# Patient Record
Sex: Female | Born: 1995 | Race: White | Hispanic: No | Marital: Single | Smoking: Former smoker
Health system: Southern US, Community
[De-identification: ages and names within clinical notes are randomized; demographics above are authoritative.]

---
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
## 2017-03-10 ENCOUNTER — Other Ambulatory Visit: Payer: Self-pay | Admitting: Obstetrics and Gynecology

## 2017-03-13 ENCOUNTER — Other Ambulatory Visit: Payer: Self-pay | Admitting: Obstetrics and Gynecology

## 2018-02-05 ENCOUNTER — Ambulatory Visit: Payer: Self-pay | Admitting: Obstetrics and Gynecology

## 2018-04-02 ENCOUNTER — Ambulatory Visit
Admission: EM | Admit: 2018-04-02 | Discharge: 2018-04-02 | Disposition: A | Discharge status: Home / Self Care | Payer: Commercial Managed Care - PPO | Attending: Family Medicine | Admitting: Family Medicine

## 2018-04-02 ENCOUNTER — Other Ambulatory Visit: Payer: Self-pay

## 2018-04-02 ENCOUNTER — Encounter: Payer: Self-pay | Admitting: Emergency Medicine

## 2018-04-02 DIAGNOSIS — J029 Acute pharyngitis, unspecified: Secondary | ICD-10-CM

## 2018-04-02 LAB — RAPID STREP SCREEN (MED CTR MEBANE ONLY): Streptococcus, Group A Screen (Direct): NEGATIVE

## 2018-04-02 MED ORDER — AMOXICILLIN-POT CLAVULANATE 875-125 MG PO TABS: 1.0000 | ORAL_TABLET | Freq: Two times a day (BID) | ORAL | 0 refills | Status: DC

## 2018-04-02 NOTE — ED Provider Notes (Signed)
[facility]    CSN: [id number] Arrival date & time: 04/02/18  1452     History   Chief Complaint Chief Complaint  Patient presents with  . Sore Throat    HPI Elizabeth Salazar is a 22 y.o. female.   HPI  22 year old female accompanied by her mother presents with a sore throat that she has had for over 3 weeks.  States at the beginning of her illness she had a temperature of 102-103 but has since become afebrile.  She is tried over-the-counter TheraFlu ibuprofen and throat lozenges but continues to have pain.  Is mostly right-sided.  History of mono  in the 2015.  Has under cervical nodes on both sides.  Afebrile today.        History reviewed. No pertinent past medical history.  There are no active problems to display for this patient.   History reviewed. No pertinent surgical history.  OB History   None      Home Medications    Prior to Admission medications   Medication Sig Start Date End Date Taking? Authorizing Provider  amoxicillin-clavulanate (AUGMENTIN) 875-125 MG tablet Take 1 tablet by mouth every 12 (twelve) hours. 04/02/18   Lutricia Feil, PA-C    Family History Family History  Problem Relation Age of Onset  . Healthy Mother   . Healthy Father     Social History Social History   Tobacco Use  . Smoking status: Former Smoker    Last attempt to quit: 03/03/2018    Years since quitting: 0.0  . Smokeless tobacco: Never Used  . Tobacco comment: social smoker  Substance Use Topics  . Alcohol use: Yes    Comment: socially  . Drug use: Never     Allergies   Patient has no known allergies.   Review of Systems Review of Systems  Constitutional: Positive for fatigue. Negative for activity change, appetite change, chills and fever.  HENT: Positive for sore throat.   All other systems reviewed and are negative.    Physical Exam Triage Vital Signs ED Triage Vitals  Enc Vitals Group     BP 04/02/18 1513 114/86   Pulse Rate 04/02/18 1513 75     Resp 04/02/18 1513 16     Temp 04/02/18 1513 98.7 F (37.1 C)     Temp Source 04/02/18 1513 Oral     SpO2 04/02/18 1513 100 %     Weight 04/02/18 1514 95 lb (43.1 kg)     Height 04/02/18 1514  (1.626 m)     Head Circumference --      Peak Flow --      Pain Score 04/02/18 1513 3     Pain Loc --      Pain Edu? --      Excl. in GC? --    No data found.  Updated Vital Signs BP 114/86 (BP Location: Left Arm)   Pulse 75   Temp 98.7 F (37.1 C) (Oral)   Resp 16   Ht  (1.626 m)   Wt 95 lb (43.1 kg)   LMP 04/02/2018 (Exact Date)   SpO2 100%   BMI 16.31 kg/m   Visual Acuity Right Eye Distance:   Left Eye Distance:   Bilateral Distance:    Right Eye Near:   Left Eye Near:    Bilateral Near:     Physical Exam  Constitutional: She is oriented to person, place, and time. She appears well-developed and well-nourished.  Non-toxic appearance. She does not appear ill. No distress.  HENT:  Head: Normocephalic.  Right Ear: Tympanic membrane and ear canal normal.  Left Ear: Tympanic membrane and ear canal normal.  Mouth/Throat: Uvula is midline and mucous membranes are normal. Oral lesions present. No uvula swelling. Oropharyngeal exudate and posterior oropharyngeal edema present. No posterior oropharyngeal erythema or tonsillar abscesses. Tonsils are 2+ on the right. Tonsillar exudate.  Examination of the throat shows enlargement of the right tonsil.  There is a small pudunculated lesion attached to the tonsil and appears to be part of the tonsillar tissue.  In the crypt are tonsillar stones.  She has extensive anterior cervical adenopathy bilateral  Eyes: Pupils are equal, round, and reactive to light.  Neck: Normal range of motion.  Abdominal: Soft.  Lymphadenopathy:    She has cervical adenopathy.  Neurological: She is alert and oriented to person, place, and time.  Skin: Skin is warm and dry.  Psychiatric: She has a normal mood and  affect. Her behavior is normal.  Nursing note and vitals reviewed.    UC Treatments / Results  Labs (all labs ordered are listed, but only abnormal results are displayed) Labs Reviewed  RAPID STREP SCREEN ([facility] ONLY)  CULTURE, GROUP A STREP [facility])    EKG None  Radiology No results found.  Procedures Procedures (including critical care time)  Medications Ordered in UC Medications - No data to display  Initial Impression / Assessment and Plan / UC Course  I have reviewed the triage vital signs and the nursing notes.  Pertinent labs & imaging results that were available during my care of the patient were reviewed by me and considered in my medical decision making (see chart for details).     Plan: 1. Test/x-ray results and diagnosis reviewed with patient 2. rx as per orders; risks, benefits, potential side effects reviewed with patient 3. Recommend supportive treatment with salt water gargles and lozenges for comfort.  Her empirically with Augmentin.  Recommend that she follow-up with ear nose and throat next week.  Cultures and sensitivities will be available in 48 hours 4. F/u prn if symptoms worsen or don't improve  Final Clinical Impressions(s) / UC Diagnoses   Final diagnoses:  Sore throat   Discharge Instructions   None    ED Prescriptions    Medication Sig Dispense Auth. Provider   amoxicillin-clavulanate (AUGMENTIN) 875-125 MG tablet Take 1 tablet by mouth every 12 (twelve) hours. 20 tablet Lutricia Feil, PA-C     Controlled Substance Prescriptions [place] Controlled Substance Registry consulted? Not Applicable   Lutricia Feil, PA-C 04/02/18 1609

## 2018-04-02 NOTE — ED Triage Notes (Signed)
Patient in today c/o sore throat x 3 weeks. Patient states she gets strep throat around this time every year. Patient had fever at the beginning of the illness of 102-103. Patient has tried OTC Theraflu and Ibuprofen and throat lozenges.

## 2018-04-05 LAB — CULTURE, GROUP A STREP (THRC)

## 2018-04-08 ENCOUNTER — Telehealth (HOSPITAL_COMMUNITY): Payer: Self-pay

## 2018-04-08 NOTE — Telephone Encounter (Signed)
Culture is positive for non group A Strep germ.  This is a finding of uncertain significance; not the typical 'strep throat' germ.  Pt states she is feeling better, already on Amoxicillin. Pt encouraged to finish treatment.

## 2018-04-19 ENCOUNTER — Ambulatory Visit: Payer: Self-pay | Admitting: Obstetrics and Gynecology

## 2018-06-11 ENCOUNTER — Other Ambulatory Visit (HOSPITAL_COMMUNITY)
Admission: RE | Admit: 2018-06-11 | Discharge: 2018-06-11 | Disposition: A | Discharge status: Home / Self Care | Payer: Commercial Managed Care - PPO | Source: Ambulatory Visit | Attending: Obstetrics and Gynecology | Admitting: Obstetrics and Gynecology

## 2018-06-11 ENCOUNTER — Encounter: Payer: Self-pay | Admitting: Obstetrics and Gynecology

## 2018-06-11 ENCOUNTER — Ambulatory Visit (INDEPENDENT_AMBULATORY_CARE_PROVIDER_SITE_OTHER): Payer: Commercial Managed Care - PPO | Admitting: Obstetrics and Gynecology

## 2018-06-11 VITALS — BP 96/62 | HR 92 | Ht 64.0 in | Wt 99.0 lb

## 2018-06-11 DIAGNOSIS — B9689 Other specified bacterial agents as the cause of diseases classified elsewhere: Secondary | ICD-10-CM

## 2018-06-11 DIAGNOSIS — Z113 Encounter for screening for infections with a predominantly sexual mode of transmission: Secondary | ICD-10-CM

## 2018-06-11 DIAGNOSIS — N760 Acute vaginitis: Secondary | ICD-10-CM | POA: Diagnosis not present

## 2018-06-11 DIAGNOSIS — Z3009 Encounter for other general counseling and advice on contraception: Secondary | ICD-10-CM

## 2018-06-11 LAB — POCT WET PREP WITH KOH
Clue Cells Wet Prep HPF POC: POSITIVE
KOH PREP POC: POSITIVE — AB
TRICHOMONAS UA: NEGATIVE
YEAST WET PREP PER HPF POC: NEGATIVE

## 2018-06-11 MED ORDER — METRONIDAZOLE 500 MG PO TABS: 500.0000 mg | ORAL_TABLET | Freq: Two times a day (BID) | ORAL | 0 refills | Status: DC

## 2018-06-11 NOTE — Progress Notes (Signed)
Patient, No Pcp Per   Chief Complaint  Patient presents with  . Gynecologic Exam    HPI:      Elizabeth Salazar is a 22 y.o. No obstetric history on file. who LMP was Patient's last menstrual period was 04/24/2018 (exact date)., presents today for increased d/c with odor, mild itch for the past 2 months. She has been treating with OTC pH balance supp without relief. No LBP, belly pain, fevers. No hx of BV. No recent abx use.   She is sex active, currently on depo for 2 inj. Last shot 5/19. She has had irreg bleeding and wants to change to non-daily method. Did OCPs in past but can't take at same time daily.   Past due for annual.    History reviewed. No pertinent past medical history.  History reviewed. No pertinent surgical history.  Family History  Problem Relation Age of Onset  . Healthy Mother   . Healthy Father     Social History   Socioeconomic History  . Marital status: Single    Spouse name: Not on file  . Number of children: Not on file  . Years of education: Not on file  . Highest education level: Not on file  Occupational History  . Not on file  Social Needs  . Financial resource strain: Not on file  . Food insecurity:    Worry: Not on file    Inability: Not on file  . Transportation needs:    Medical: Not on file    Non-medical: Not on file  Tobacco Use  . Smoking status: Former Smoker    Last attempt to quit: 03/03/2018    Years since quitting: 0.2  . Smokeless tobacco: Never Used  . Tobacco comment: social smoker  Substance and Sexual Activity  . Alcohol use: Yes    Comment: socially  . Drug use: Never  . Sexual activity: Yes  Lifestyle  . Physical activity:    Days per week: Not on file    Minutes per session: Not on file  . Stress: Not on file  Relationships  . Social connections:    Talks on phone: Not on file    Gets together: Not on file    Attends religious service: Not on file    Active member of club or  organization: Not on file    Attends meetings of clubs or organizations: Not on file    Relationship status: Not on file  . Intimate partner violence:    Fear of current or ex partner: Not on file    Emotionally abused: Not on file    Physically abused: Not on file    Forced sexual activity: Not on file  Other Topics Concern  . Not on file  Social History Narrative  . Not on file    Outpatient Medications Prior to Visit  Medication Sig Dispense Refill  . amoxicillin-clavulanate (AUGMENTIN) 875-125 MG tablet Take 1 tablet by mouth every 12 (twelve) hours. 20 tablet 0   No facility-administered medications prior to visit.       ROS:  Review of Systems  Constitutional: Positive for fatigue. Negative for fever.  Gastrointestinal: Negative for blood in stool, constipation, diarrhea, nausea and vomiting.  Genitourinary: Positive for dyspareunia and vaginal discharge. Negative for dysuria, flank pain, frequency, hematuria, urgency, vaginal bleeding and vaginal pain.  Musculoskeletal: Negative for back pain.  Skin: Negative for rash.    OBJECTIVE:   Vitals:  BP 96/62 (BP  Location: Left Arm, Patient Position: Sitting, Cuff Size: Normal)   Pulse 92   Ht 5\' 4"  (1.626 m)   Wt 99 lb (44.9 kg)   LMP 04/24/2018 (Exact Date)   SpO2 100%   BMI 16.99 kg/m   Physical Exam  Constitutional: She is oriented to person, place, and time. Vital signs are normal. She appears well-developed.  Pulmonary/Chest: Effort normal.  Genitourinary: Vagina normal and uterus normal. There is no rash, tenderness or lesion on the right labia. There is no rash, tenderness or lesion on the left labia. Uterus is not enlarged and not tender. Cervix exhibits no motion tenderness. Right adnexum displays no mass and no tenderness. Left adnexum displays no mass and no tenderness. No erythema or tenderness in the vagina. No vaginal discharge found.  Musculoskeletal: Normal range of motion.  Neurological: She is  alert and oriented to person, place, and time.  Psychiatric: She has a normal mood and affect. Her behavior is normal. Thought content normal.  Vitals reviewed.   Results: Results for orders placed or performed in visit on 06/11/18 (from the past 24 hour(s))  POCT Wet Prep with KOH     Status: Abnormal   Collection Time: 06/11/18 10:18 AM  Result Value Ref Range   Trichomonas, UA Negative    Clue Cells Wet Prep HPF POC pos    Epithelial Wet Prep HPF POC  Few, Moderate, Many, Too numerous to count   Yeast Wet Prep HPF POC neg    Bacteria Wet Prep HPF POC  Few   RBC Wet Prep HPF POC     WBC Wet Prep HPF POC     KOH Prep POC Positive (A) Negative     Assessment/Plan: Bacterial vaginosis - Pos sx/wet prep. Rx flagyl. No EtOH. Will RF if sx recur. F/u prn.  - Plan: POCT Wet Prep with KOH, metroNIDAZOLE (FLAGYL) 500 MG tablet  Screening for STD (sexually transmitted disease) - Plan: Cervicovaginal ancillary only  Encounter for other general counseling or advice on contraception - Discussed non-daily methods. Pt due to start new BC by 07/03/18. RTO for annual/BC Rx.    Meds ordered this encounter  Medications  . metroNIDAZOLE (FLAGYL) 500 MG tablet    Sig: Take 1 tablet (500 mg total) by mouth 2 (two) times daily for 7 days.    Dispense:  14 tablet    Refill:  0    Order Specific Question:   Supervising Provider    Answer:   Nadara Mustard [[id number]]      Return in about 2 weeks (around 06/25/2018) for annual.  Elizabeth B. Copland, PA-C 06/11/2018 10:20 AM

## 2018-06-11 NOTE — Patient Instructions (Signed)
I value your feedback and entrusting us with your care. If you get a [facility] patient survey, I would appreciate you taking the time to let us know about your experience today. Thank you! 

## 2018-06-13 LAB — CERVICOVAGINAL ANCILLARY ONLY
Chlamydia: NEGATIVE
Neisseria Gonorrhea: NEGATIVE
TRICH (WINDOWPATH): NEGATIVE

## 2018-07-04 DIAGNOSIS — B009 Herpesviral infection, unspecified: Secondary | ICD-10-CM | POA: Insufficient documentation

## 2018-07-04 LAB — HM HIV SCREENING LAB: HM HIV Screening: NEGATIVE

## 2018-07-09 ENCOUNTER — Other Ambulatory Visit: Payer: Self-pay | Admitting: Obstetrics and Gynecology

## 2018-07-09 DIAGNOSIS — N760 Acute vaginitis: Principal | ICD-10-CM

## 2018-07-09 DIAGNOSIS — B9689 Other specified bacterial agents as the cause of diseases classified elsewhere: Secondary | ICD-10-CM

## 2018-07-09 NOTE — Telephone Encounter (Signed)
Please advise 

## 2018-08-07 ENCOUNTER — Other Ambulatory Visit: Payer: Self-pay | Admitting: Obstetrics and Gynecology

## 2018-08-07 DIAGNOSIS — N760 Acute vaginitis: Principal | ICD-10-CM

## 2018-08-07 DIAGNOSIS — B9689 Other specified bacterial agents as the cause of diseases classified elsewhere: Secondary | ICD-10-CM

## 2018-08-08 NOTE — Telephone Encounter (Signed)
Called pt and left vm to call back. Pt needs to schedule annual. Rx RF eRxd.

## 2018-08-08 NOTE — Telephone Encounter (Signed)
Patient is schedule 08/21/18 with [facility] for annual

## 2018-08-08 NOTE — Telephone Encounter (Signed)
Pls call pt to sched annual. Rx RF eRxd.

## 2018-08-08 NOTE — Telephone Encounter (Signed)
Please advise 

## 2018-08-21 ENCOUNTER — Ambulatory Visit: Payer: Commercial Managed Care - PPO | Admitting: Obstetrics and Gynecology

## 2018-09-11 DIAGNOSIS — L089 Local infection of the skin and subcutaneous tissue, unspecified: Secondary | ICD-10-CM | POA: Diagnosis not present

## 2018-09-11 DIAGNOSIS — Z23 Encounter for immunization: Secondary | ICD-10-CM | POA: Diagnosis not present

## 2018-09-11 DIAGNOSIS — S61219A Laceration without foreign body of unspecified finger without damage to nail, initial encounter: Secondary | ICD-10-CM | POA: Diagnosis not present

## 2019-05-27 ENCOUNTER — Other Ambulatory Visit: Payer: Self-pay

## 2019-05-27 DIAGNOSIS — Z20822 Contact with and (suspected) exposure to covid-19: Secondary | ICD-10-CM

## 2019-05-27 NOTE — Progress Notes (Signed)
rec'd referral from [facility]. HD to schedule for COVID testing.  Orders placed.

## 2019-05-28 ENCOUNTER — Other Ambulatory Visit: Payer: Commercial Managed Care - PPO

## 2019-05-28 DIAGNOSIS — Z20822 Contact with and (suspected) exposure to covid-19: Secondary | ICD-10-CM

## 2020-02-09 ENCOUNTER — Other Ambulatory Visit: Payer: Self-pay | Admitting: Internal Medicine

## 2020-02-09 DIAGNOSIS — Z01818 Encounter for other preprocedural examination: Secondary | ICD-10-CM

## 2020-02-09 DIAGNOSIS — E221 Hyperprolactinemia: Secondary | ICD-10-CM

## 2020-02-12 ENCOUNTER — Ambulatory Visit
Admission: RE | Admit: 2020-02-12 | Discharge: 2020-02-12 | Disposition: A | Discharge status: Home / Self Care | Payer: Commercial Managed Care - PPO | Source: Ambulatory Visit | Attending: Internal Medicine | Admitting: Internal Medicine

## 2020-02-12 ENCOUNTER — Other Ambulatory Visit: Payer: Self-pay

## 2020-02-12 DIAGNOSIS — Z01818 Encounter for other preprocedural examination: Secondary | ICD-10-CM | POA: Diagnosis present

## 2020-02-12 DIAGNOSIS — E221 Hyperprolactinemia: Secondary | ICD-10-CM | POA: Insufficient documentation

## 2020-02-12 MED ORDER — GADOBUTROL 1 MMOL/ML IV SOLN
7.5000 mL | Freq: Once | INTRAVENOUS | Status: AC | PRN
  Administered 2020-02-12: 7.5 mL via INTRAVENOUS

## 2020-02-16 ENCOUNTER — Other Ambulatory Visit: Payer: Self-pay | Admitting: Advanced Practice Midwife

## 2020-02-17 NOTE — Telephone Encounter (Signed)
TC from patient. States needs Acyclovir refill. RN informed patient that we have not seen her since 06/2018. Patient does not have PCP and denies need for birth control  STI appt scheduled for 02/19/20 Richmond Campbell, RN

## 2020-02-17 NOTE — Telephone Encounter (Signed)
Patient with appointment on 02/19/2020 for an in-person visit.  Patient last seen here 06/2018, and will approve one time refill but no more until has had her in-person visit,.

## 2020-02-17 NOTE — Telephone Encounter (Signed)
needs refill on gh medication . pt uses [facility]

## 2020-02-18 DIAGNOSIS — B009 Herpesviral infection, unspecified: Secondary | ICD-10-CM

## 2020-02-19 ENCOUNTER — Encounter: Payer: Self-pay | Admitting: Physician Assistant

## 2020-02-19 ENCOUNTER — Other Ambulatory Visit: Payer: Self-pay

## 2020-02-19 ENCOUNTER — Ambulatory Visit: Payer: Self-pay | Admitting: Physician Assistant

## 2020-02-19 DIAGNOSIS — Z113 Encounter for screening for infections with a predominantly sexual mode of transmission: Secondary | ICD-10-CM

## 2020-02-19 DIAGNOSIS — Z299 Encounter for prophylactic measures, unspecified: Secondary | ICD-10-CM

## 2020-02-19 MED ORDER — ACYCLOVIR 800 MG PO TABS: 800.0000 mg | ORAL_TABLET | Freq: Every day | ORAL | 12 refills | Status: DC

## 2020-02-19 NOTE — Progress Notes (Signed)
Here today for refill on "Herpes Medication." Declines all STD screening. Tawny Hopping, RN

## 2020-02-19 NOTE — Progress Notes (Signed)
  [facility] Department STI clinic/screening visit  Subjective:  Elizabeth Salazar is a 24 y.o. female being seen today for an STI screening visit. The patient reports they do not have symptoms.  Patient reports that they might desire a pregnancy in the next year.   They reported they are not interested in discussing contraception today.  Patient's last menstrual period was 02/01/2020 (exact date).   Patient has the following medical conditions:   Patient Active Problem List   Diagnosis Date Noted  . 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 SEXUALLY TRANSMITTED DISEASE    HPI  Patient reports that she is here for a refill on HSV-2 Rx for suppressive therapy.  Denies current outbreak, but is out of refills.  Declines screening exam and lab work today.  See flowsheet for further details and programmatic requirements.    The following portions of the patient's history were reviewed and updated as appropriate: allergies, current medications, past medical history, past social history, past surgical history and problem list.  Objective:  There were no vitals filed for this visit.  Physical Exam Constitutional:      General: She is not in acute distress.    Appearance: Normal appearance. She is normal weight.  HENT:     Head: Normocephalic.  Eyes:     Conjunctiva/sclera: Conjunctivae normal.  Pulmonary:     Effort: Pulmonary effort is normal.  Neurological:     Mental Status: She is alert and oriented to person, place, and time.  Psychiatric:        Mood and Affect: Mood normal.        Behavior: Behavior normal.        Thought Content: Thought content normal.        Judgment: Judgment normal.      Assessment and Plan:  Elizabeth Salazar is a 24 y.o. female presenting to the [facility] Department for STI screening  1. Screening for STD (sexually transmitted disease) Patient her for Rx for HSV suppressive tx. Declines  screening exam and lab work today. Rec condoms with all sex.  2. Prophylactic measure Counseled patient that I can write for 800mg  to take QD instead ofr 400mg  to take BID if patient would be ok with that and patient agrees that would be OK and more convenient.  Rx handwritten for Acyclovir 800 mg #30 1 po daily with refills for 1 year given to patient. Call with questions or concerns and RTC 1 yr for annual Rx visit. - acyclovir (ZOVIRAX) 800 MG tablet; Take 1 tablet (800 mg total) by mouth daily.  Dispense: 30 tablet; Refill: 12     No follow-ups on file.  No future appointments.  , PA

## 2020-06-20 DIAGNOSIS — Z419 Encounter for procedure for purposes other than remedying health state, unspecified: Secondary | ICD-10-CM | POA: Diagnosis not present

## 2020-07-19 ENCOUNTER — Encounter: Payer: Commercial Managed Care - PPO | Admitting: Obstetrics

## 2020-07-21 DIAGNOSIS — Z419 Encounter for procedure for purposes other than remedying health state, unspecified: Secondary | ICD-10-CM | POA: Diagnosis not present

## 2020-08-02 DIAGNOSIS — Z3201 Encounter for pregnancy test, result positive: Secondary | ICD-10-CM | POA: Diagnosis not present

## 2020-08-02 DIAGNOSIS — N912 Amenorrhea, unspecified: Secondary | ICD-10-CM | POA: Diagnosis not present

## 2020-08-02 DIAGNOSIS — Z3403 Encounter for supervision of normal first pregnancy, third trimester: Secondary | ICD-10-CM | POA: Insufficient documentation

## 2020-08-20 DIAGNOSIS — Z419 Encounter for procedure for purposes other than remedying health state, unspecified: Secondary | ICD-10-CM | POA: Diagnosis not present

## 2020-08-30 DIAGNOSIS — Z3401 Encounter for supervision of normal first pregnancy, first trimester: Secondary | ICD-10-CM | POA: Diagnosis not present

## 2020-08-30 DIAGNOSIS — O468X1 Other antepartum hemorrhage, first trimester: Secondary | ICD-10-CM | POA: Diagnosis not present

## 2020-08-30 DIAGNOSIS — Z113 Encounter for screening for infections with a predominantly sexual mode of transmission: Secondary | ICD-10-CM | POA: Diagnosis not present

## 2020-08-30 DIAGNOSIS — O418X10 Other specified disorders of amniotic fluid and membranes, first trimester, not applicable or unspecified: Secondary | ICD-10-CM | POA: Diagnosis not present

## 2020-08-30 LAB — OB RESULTS CONSOLE HEPATITIS B SURFACE ANTIGEN: Hepatitis B Surface Ag: NEGATIVE

## 2020-08-30 LAB — OB RESULTS CONSOLE VARICELLA ZOSTER ANTIBODY, IGG: Varicella: IMMUNE

## 2020-08-30 LAB — OB RESULTS CONSOLE GC/CHLAMYDIA
Chlamydia: NEGATIVE
Gonorrhea: NEGATIVE

## 2020-08-30 LAB — OB RESULTS CONSOLE RUBELLA ANTIBODY, IGM: Rubella: IMMUNE

## 2020-09-20 DIAGNOSIS — Z419 Encounter for procedure for purposes other than remedying health state, unspecified: Secondary | ICD-10-CM | POA: Diagnosis not present

## 2020-09-27 DIAGNOSIS — O418X10 Other specified disorders of amniotic fluid and membranes, first trimester, not applicable or unspecified: Secondary | ICD-10-CM | POA: Diagnosis not present

## 2020-09-27 DIAGNOSIS — O468X1 Other antepartum hemorrhage, first trimester: Secondary | ICD-10-CM | POA: Diagnosis not present

## 2020-09-27 DIAGNOSIS — Z23 Encounter for immunization: Secondary | ICD-10-CM | POA: Diagnosis not present

## 2020-10-20 DIAGNOSIS — Z419 Encounter for procedure for purposes other than remedying health state, unspecified: Secondary | ICD-10-CM | POA: Diagnosis not present

## 2020-11-20 DIAGNOSIS — Z419 Encounter for procedure for purposes other than remedying health state, unspecified: Secondary | ICD-10-CM | POA: Diagnosis not present

## 2020-11-20 NOTE — L&D Delivery Note (Signed)
Delivery Note  First Stage: Labor onset: 4/17 at 2330, admitted at 0730 on 4/18. Augmentation: Pitocin, AROM Analgesia /Anesthesia intrapartum: epidural AROM at 1508  Second Stage: Complete dilation at 1746 Onset of pushing at 1748 FHR second stage Cat II- variables  Delivery of a viable female infant on 03/07/21 at 1809 by CNM delivery of fetal head in LOA position with restitution to LOT. No nuchal cord;  Anterior then posterior shoulders delivered easily with gentle downward traction. Baby placed on mom's chest, and attended to by peds.  Cord double clamped after cessation of pulsation, cut by FOb Cord blood sample collected   Third Stage: Placenta delivered spontaneously intact with 3VC @ 1814 Placenta disposition: routine disposal Uterine tone Firm / bleeding small  Bilateral hemostatic labial lacerations identified  Anesthesia for repair: epidural Repair left labial laceration to approximate edges.  Est. Blood Loss (mL):  Complications: none  Mom to postpartum.  Baby to Couplet care / Skin to Skin.  Newborn: Birth Weight: pending  Apgar Scores: 8/9 Feeding planned: breast

## 2020-12-20 DIAGNOSIS — Z23 Encounter for immunization: Secondary | ICD-10-CM | POA: Diagnosis not present

## 2020-12-20 DIAGNOSIS — O26893 Other specified pregnancy related conditions, third trimester: Secondary | ICD-10-CM | POA: Diagnosis not present

## 2020-12-20 DIAGNOSIS — Z3402 Encounter for supervision of normal first pregnancy, second trimester: Secondary | ICD-10-CM | POA: Diagnosis not present

## 2020-12-20 DIAGNOSIS — Z6791 Unspecified blood type, Rh negative: Secondary | ICD-10-CM | POA: Diagnosis not present

## 2020-12-21 DIAGNOSIS — Z419 Encounter for procedure for purposes other than remedying health state, unspecified: Secondary | ICD-10-CM | POA: Diagnosis not present

## 2021-01-18 DIAGNOSIS — Z419 Encounter for procedure for purposes other than remedying health state, unspecified: Secondary | ICD-10-CM | POA: Diagnosis not present

## 2021-02-09 LAB — OB RESULTS CONSOLE RPR: RPR: NONREACTIVE

## 2021-02-09 LAB — OB RESULTS CONSOLE GBS: GBS: NEGATIVE

## 2021-02-09 LAB — OB RESULTS CONSOLE HIV ANTIBODY (ROUTINE TESTING): HIV: NONREACTIVE

## 2021-02-18 DIAGNOSIS — Z419 Encounter for procedure for purposes other than remedying health state, unspecified: Secondary | ICD-10-CM | POA: Diagnosis not present

## 2021-02-22 ENCOUNTER — Other Ambulatory Visit: Payer: Self-pay | Admitting: Physician Assistant

## 2021-02-22 DIAGNOSIS — Z299 Encounter for prophylactic measures, unspecified: Secondary | ICD-10-CM

## 2021-02-23 NOTE — Telephone Encounter (Signed)
Per chart review, patient seen for annual visit on 02/19/2020 and given Rx for suppressive tx with Acyclovir 800 mg #30 1 po daily.  Will OK refills for 3 months and patient will need an in-person visit for further refills.

## 2021-03-01 ENCOUNTER — Other Ambulatory Visit: Payer: Self-pay | Admitting: Obstetrics and Gynecology

## 2021-03-01 NOTE — Progress Notes (Signed)
  Elizabeth Salazar is a 25 y.o. G1P0 female at [redacted]w[redacted]d dated by 8 week u/s.  She will present to L&D for elective IOL  Pregnancy Issues: 1. HSV - was started on prophylaxis at 36 weeks 2. RH negative - Rhogam: Given 12/20/20  Prenatal care site: [facility]    Pertinent Results:  Prenatal Labs: Blood type/Rh A neg  Antibody screen neg  Rubella Immune  Varicella Immune  RPR NR  HBsAg Neg  HIV NR  GC neg  Chlamydia neg  Genetic screening declined  1 hour GTT 133  3 hour GTT   GBS Neg   Post Partum Planning: - Infant feeding: Breastfeeding - Contraception: POPs - Tdap: 12/20/20 - Flu: 09/27/20  Haroldine Laws, CNM 03/01/2021 9:45 AM

## 2021-03-07 ENCOUNTER — Inpatient Hospital Stay: Payer: Managed Care, Other (non HMO) | Admitting: Registered Nurse

## 2021-03-07 ENCOUNTER — Other Ambulatory Visit: Payer: Self-pay

## 2021-03-07 ENCOUNTER — Inpatient Hospital Stay
Admission: EM | Admit: 2021-03-07 | Discharge: 2021-03-09 | DRG: 806 | Disposition: A | Discharge status: Home / Self Care | Payer: Managed Care, Other (non HMO) | Attending: Obstetrics and Gynecology | Admitting: Obstetrics and Gynecology

## 2021-03-07 DIAGNOSIS — O9081 Anemia of the puerperium: Secondary | ICD-10-CM | POA: Diagnosis not present

## 2021-03-07 DIAGNOSIS — A6000 Herpesviral infection of urogenital system, unspecified: Secondary | ICD-10-CM | POA: Diagnosis present

## 2021-03-07 DIAGNOSIS — Z87891 Personal history of nicotine dependence: Secondary | ICD-10-CM | POA: Diagnosis not present

## 2021-03-07 DIAGNOSIS — Z8616 Personal history of COVID-19: Secondary | ICD-10-CM | POA: Diagnosis not present

## 2021-03-07 DIAGNOSIS — O4290 Premature rupture of membranes, unspecified as to length of time between rupture and onset of labor, unspecified weeks of gestation: Secondary | ICD-10-CM | POA: Diagnosis present

## 2021-03-07 DIAGNOSIS — Z3A39 39 weeks gestation of pregnancy: Secondary | ICD-10-CM

## 2021-03-07 DIAGNOSIS — D62 Acute posthemorrhagic anemia: Secondary | ICD-10-CM | POA: Diagnosis not present

## 2021-03-07 DIAGNOSIS — Z6791 Unspecified blood type, Rh negative: Secondary | ICD-10-CM

## 2021-03-07 DIAGNOSIS — O9832 Other infections with a predominantly sexual mode of transmission complicating childbirth: Secondary | ICD-10-CM | POA: Diagnosis present

## 2021-03-07 DIAGNOSIS — Z3490 Encounter for supervision of normal pregnancy, unspecified, unspecified trimester: Secondary | ICD-10-CM | POA: Diagnosis present

## 2021-03-07 DIAGNOSIS — O26893 Other specified pregnancy related conditions, third trimester: Principal | ICD-10-CM | POA: Diagnosis present

## 2021-03-07 LAB — CBC
HCT: 34.9 % — ABNORMAL LOW (ref 36.0–46.0)
Hemoglobin: 11.9 g/dL — ABNORMAL LOW (ref 12.0–15.0)
MCH: 30.8 pg (ref 26.0–34.0)
MCHC: 34.1 g/dL (ref 30.0–36.0)
MCV: 90.4 fL (ref 80.0–100.0)
Platelets: 214 10*3/uL (ref 150–400)
RBC: 3.86 MIL/uL — ABNORMAL LOW (ref 3.87–5.11)
RDW: 12.9 % (ref 11.5–15.5)
WBC: 8.8 10*3/uL (ref 4.0–10.5)
nRBC: 0.5 % — ABNORMAL HIGH (ref 0.0–0.2)

## 2021-03-07 LAB — ABO/RH: ABO/RH(D): A NEG

## 2021-03-07 LAB — RESP PANEL BY RT-PCR (FLU A&B, COVID) ARPGX2
Influenza A by PCR: NEGATIVE
Influenza B by PCR: NEGATIVE
SARS Coronavirus 2 by RT PCR: POSITIVE — AB

## 2021-03-07 LAB — RUPTURE OF MEMBRANE (ROM)PLUS: Rom Plus: NEGATIVE

## 2021-03-07 MED ORDER — LIDOCAINE HCL (PF) 1 % IJ SOLN
INTRAMUSCULAR | Status: AC
  Filled 2021-03-07: qty 30

## 2021-03-07 MED ORDER — DIBUCAINE (PERIANAL) 1 % EX OINT: 1.0000 "application " | TOPICAL_OINTMENT | CUTANEOUS | Status: DC | PRN

## 2021-03-07 MED ORDER — TERBUTALINE SULFATE 1 MG/ML IJ SOLN: 0.2500 mg | Freq: Once | INTRAMUSCULAR | Status: DC | PRN

## 2021-03-07 MED ORDER — FENTANYL CITRATE (PF) 100 MCG/2ML IJ SOLN
100.0000 ug | INTRAMUSCULAR | Status: DC | PRN
  Administered 2021-03-07: 100 ug via INTRAVENOUS
  Filled 2021-03-07: qty 2

## 2021-03-07 MED ORDER — PHENYLEPHRINE 40 MCG/ML (10ML) SYRINGE FOR IV PUSH (FOR BLOOD PRESSURE SUPPORT): 80.0000 ug | PREFILLED_SYRINGE | INTRAVENOUS | Status: DC | PRN

## 2021-03-07 MED ORDER — FENTANYL 2.5 MCG/ML W/ROPIVACAINE 0.15% IN NS 100 ML EPIDURAL (ARMC)
12.0000 mL/h | EPIDURAL | Status: DC
Start: 2021-03-07 — End: 2021-03-07
  Administered 2021-03-07: 12 mL/h via EPIDURAL

## 2021-03-07 MED ORDER — OXYCODONE-ACETAMINOPHEN 5-325 MG PO TABS: 2.0000 | ORAL_TABLET | ORAL | Status: DC | PRN

## 2021-03-07 MED ORDER — SENNOSIDES-DOCUSATE SODIUM 8.6-50 MG PO TABS
2.0000 | ORAL_TABLET | Freq: Every day | ORAL | Status: DC
  Administered 2021-03-08 – 2021-03-09 (×2): 2 via ORAL
  Filled 2021-03-07 (×2): qty 2

## 2021-03-07 MED ORDER — ZOLPIDEM TARTRATE 5 MG PO TABS: 5.0000 mg | ORAL_TABLET | Freq: Every evening | ORAL | Status: DC | PRN

## 2021-03-07 MED ORDER — EPHEDRINE 5 MG/ML INJ: 10.0000 mg | INTRAVENOUS | Status: DC | PRN

## 2021-03-07 MED ORDER — PRENATAL MULTIVITAMIN CH
1.0000 | ORAL_TABLET | Freq: Every day | ORAL | Status: DC
  Administered 2021-03-08 – 2021-03-09 (×2): 1 via ORAL
  Filled 2021-03-07 (×2): qty 1

## 2021-03-07 MED ORDER — SIMETHICONE 80 MG PO CHEW: 80.0000 mg | CHEWABLE_TABLET | ORAL | Status: DC | PRN

## 2021-03-07 MED ORDER — ACETAMINOPHEN 325 MG PO TABS: 650.0000 mg | ORAL_TABLET | ORAL | Status: DC | PRN

## 2021-03-07 MED ORDER — WITCH HAZEL-GLYCERIN EX PADS: 1.0000 "application " | MEDICATED_PAD | CUTANEOUS | Status: DC | PRN

## 2021-03-07 MED ORDER — ONDANSETRON HCL 4 MG PO TABS: 4.0000 mg | ORAL_TABLET | ORAL | Status: DC | PRN

## 2021-03-07 MED ORDER — LIDOCAINE-EPINEPHRINE (PF) 1.5 %-1:200000 IJ SOLN
INTRAMUSCULAR | Status: DC | PRN
  Administered 2021-03-07: 3 mL via EPIDURAL

## 2021-03-07 MED ORDER — MISOPROSTOL 25 MCG QUARTER TABLET: 25.0000 ug | ORAL_TABLET | ORAL | Status: DC | PRN

## 2021-03-07 MED ORDER — LIDOCAINE HCL (PF) 1 % IJ SOLN: 30.0000 mL | INTRAMUSCULAR | Status: DC | PRN

## 2021-03-07 MED ORDER — LACTATED RINGERS IV SOLN
500.0000 mL | Freq: Once | INTRAVENOUS | Status: AC
  Administered 2021-03-07: 500 mL via INTRAVENOUS

## 2021-03-07 MED ORDER — ONDANSETRON HCL 4 MG/2ML IJ SOLN: 4.0000 mg | Freq: Four times a day (QID) | INTRAMUSCULAR | Status: DC | PRN

## 2021-03-07 MED ORDER — DIPHENHYDRAMINE HCL 50 MG/ML IJ SOLN: 12.5000 mg | INTRAMUSCULAR | Status: DC | PRN

## 2021-03-07 MED ORDER — LACTATED RINGERS IV SOLN
500.0000 mL | INTRAVENOUS | Status: DC | PRN
  Administered 2021-03-07: 1000 mL via INTRAVENOUS

## 2021-03-07 MED ORDER — ACETAMINOPHEN 325 MG PO TABS
650.0000 mg | ORAL_TABLET | ORAL | Status: DC | PRN
  Administered 2021-03-07 – 2021-03-09 (×6): 650 mg via ORAL
  Filled 2021-03-07 (×7): qty 2

## 2021-03-07 MED ORDER — OXYTOCIN 10 UNIT/ML IJ SOLN
INTRAMUSCULAR | Status: AC
  Filled 2021-03-07: qty 2

## 2021-03-07 MED ORDER — ACETAMINOPHEN 500 MG PO TABS: 1000.0000 mg | ORAL_TABLET | Freq: Four times a day (QID) | ORAL | Status: DC | PRN

## 2021-03-07 MED ORDER — BUPIVACAINE HCL (PF) 0.25 % IJ SOLN
INTRAMUSCULAR | Status: DC | PRN
  Administered 2021-03-07 (×2): 4 mL via EPIDURAL

## 2021-03-07 MED ORDER — BENZOCAINE-MENTHOL 20-0.5 % EX AERO
1.0000 "application " | INHALATION_SPRAY | CUTANEOUS | Status: DC | PRN
  Administered 2021-03-07: 1 via TOPICAL
  Filled 2021-03-07: qty 56

## 2021-03-07 MED ORDER — ONDANSETRON HCL 4 MG/2ML IJ SOLN: 4.0000 mg | INTRAMUSCULAR | Status: DC | PRN

## 2021-03-07 MED ORDER — COCONUT OIL OIL
1.0000 "application " | TOPICAL_OIL | Status: DC | PRN
  Filled 2021-03-07: qty 120

## 2021-03-07 MED ORDER — FERROUS SULFATE 325 (65 FE) MG PO TABS
325.0000 mg | ORAL_TABLET | Freq: Two times a day (BID) | ORAL | Status: DC
  Administered 2021-03-08 – 2021-03-09 (×4): 325 mg via ORAL
  Filled 2021-03-07 (×4): qty 1

## 2021-03-07 MED ORDER — IBUPROFEN 600 MG PO TABS
600.0000 mg | ORAL_TABLET | Freq: Four times a day (QID) | ORAL | Status: DC
  Administered 2021-03-08: 600 mg via ORAL
  Filled 2021-03-07 (×2): qty 1

## 2021-03-07 MED ORDER — OXYTOCIN BOLUS FROM INFUSION
333.0000 mL | Freq: Once | INTRAVENOUS | Status: AC
  Administered 2021-03-07: 333 mL via INTRAVENOUS

## 2021-03-07 MED ORDER — OXYTOCIN-SODIUM CHLORIDE 30-0.9 UT/500ML-% IV SOLN
2.5000 [IU]/h | INTRAVENOUS | Status: DC
  Administered 2021-03-07 (×2): 2.5 [IU]/h via INTRAVENOUS
  Filled 2021-03-07: qty 500

## 2021-03-07 MED ORDER — OXYCODONE-ACETAMINOPHEN 5-325 MG PO TABS: 1.0000 | ORAL_TABLET | ORAL | Status: DC | PRN

## 2021-03-07 MED ORDER — FENTANYL 2.5 MCG/ML W/ROPIVACAINE 0.15% IN NS 100 ML EPIDURAL (ARMC)
EPIDURAL | Status: AC
  Filled 2021-03-07: qty 100

## 2021-03-07 MED ORDER — DIPHENHYDRAMINE HCL 25 MG PO CAPS: 25.0000 mg | ORAL_CAPSULE | Freq: Four times a day (QID) | ORAL | Status: DC | PRN

## 2021-03-07 MED ORDER — SOD CITRATE-CITRIC ACID 500-334 MG/5ML PO SOLN: 30.0000 mL | ORAL | Status: DC | PRN

## 2021-03-07 MED ORDER — OXYTOCIN-SODIUM CHLORIDE 30-0.9 UT/500ML-% IV SOLN
1.0000 m[IU]/min | INTRAVENOUS | Status: DC
  Administered 2021-03-07: 2 m[IU]/min via INTRAVENOUS
  Filled 2021-03-07: qty 500

## 2021-03-07 MED ORDER — LIDOCAINE HCL (PF) 1 % IJ SOLN
INTRAMUSCULAR | Status: DC | PRN
  Administered 2021-03-07: 3 mL via SUBCUTANEOUS

## 2021-03-07 MED ORDER — LACTATED RINGERS IV SOLN: INTRAVENOUS | Status: DC

## 2021-03-07 MED ORDER — MISOPROSTOL 200 MCG PO TABS
ORAL_TABLET | ORAL | Status: AC
  Filled 2021-03-07: qty 4

## 2021-03-07 MED ORDER — CALCIUM CARBONATE ANTACID 500 MG PO CHEW: 2.0000 | CHEWABLE_TABLET | ORAL | Status: DC | PRN

## 2021-03-07 MED ORDER — IBUPROFEN 600 MG PO TABS
600.0000 mg | ORAL_TABLET | Freq: Four times a day (QID) | ORAL | Status: DC
  Administered 2021-03-07: 600 mg via ORAL
  Filled 2021-03-07: qty 1

## 2021-03-07 MED ORDER — AMMONIA AROMATIC IN INHA
RESPIRATORY_TRACT | Status: AC
  Filled 2021-03-07: qty 10

## 2021-03-07 NOTE — H&P (Signed)
OB History & Physical   History of Present Illness:   Chief Complaint: Contractions and leakage of fluid   HPI:  Elizabeth Salazar is a 25 y.o. G1P0 female at [redacted]w[redacted]d dated by [place] at [redacted]w[redacted]d, not c/w LMP of 06/08/2020.  She presents to L&D for contractions that started at 2330 last night and have gotten progressively worse.  She reports a gush of fluid.  Denies vaginal bleeding, endorses good fetal movement.   Reports active fetal movement  Contractions: every 5 to 7 minutes starting at 2330 last night LOF/SROM: unsure, reports a gush of fluid  Vaginal bleeding: denies   Pregnancy Issues: 1. HSV - was started on prophylaxis at 36 weeks 2. RH negative - Rhogam: Given 12/20/20  Patient Active Problem List   Diagnosis Date Noted  . Leakage of amniotic fluid 03/07/2021  . Encounter for elective induction of labor 03/07/2021  . 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 No pertinent past medical history.  History reviewed. No pertinent surgical history.  No Known Allergies  Prior to Admission medications   Medication Sig Start Date End Date Taking? Authorizing Provider  acyclovir (ZOVIRAX) 800 MG tablet Take 1 tablet by mouth once daily 02/23/21  Yes Hampton, Carla J, PA  acyclovir (ZOVIRAX) 400 MG tablet TAKE 1 TABLET BY MOUTH TWICE A DAY 02/17/20   Hampton, Carla J, PA  metroNIDAZOLE (FLAGYL) 500 MG tablet TAKE 1 TABLET BY MOUTH TWICE A DAY FOR 7 DAYS Patient not taking: No sig reported 08/08/18   Copland, Ilona Sorrel, PA-C     Prenatal care site:  [facility]  Social History: She  reports that she quit smoking about 3 years ago. She has never used smokeless tobacco. She reports current alcohol use. She reports that she does not use drugs.  Family History: family history includes Healthy in her father and mother.   Review of Systems: A full review of systems was performed and negative except as noted in the HPI.     Physical Exam:  Vital  Signs: LMP 06/08/2020  Physical Exam  General: no acute distress.  HEENT: normocephalic, atraumatic Heart: regular rate & rhythm.  No murmurs/rubs/gallops Lungs: clear to auscultation bilaterally, normal respiratory effort Abdomen: soft, gravid, non-tender;  EFW: 7 lbs Pelvic:   External: Normal external female genitalia  Cervix: Dilation: 5 / Effacement (%): 80 / Station: -2    Extremities: non-tender, symmetric, no edema bilaterally.  DTRs: 2+/2+  Neurologic: Alert & oriented x 3.    No results found for this or any previous visit (from the past 24 hour(s)).  Pertinent Results:  Prenatal Labs: Blood type/Rh A neg  Antibody screen neg  Rubella Immune  Varicella Immune  RPR NR  HBsAg Neg  HIV NR  GC neg  Chlamydia neg  Genetic screening declined  1 hour GTT 133  3 hour GTT   GBS Neg    FHT:  FHR: 135 bpm, variability: moderate,  accelerations:  Present,  decelerations:  Absent Category/reactivity:  Category I UC:   regular, every 5-7 minutes   Cephalic by Leopolds and SVE   No results found.  Assessment:  Lafonda Patron is a 25 y.o. G1P0 female at [redacted]w[redacted]d with active labor.   Plan:  1. Admit to Labor & Delivery; consents reviewed and obtained - Covid admission screen pending   2. Fetal Well being  - Fetal Tracing: cat 1 - Group B Streptococcus ppx not indicated: GBS neg - Presentation:  cephalic confirmed by SVE   3. Routine OB: - Prenatal labs reviewed, as above - Rh neg - CBC, T&S, RPR on admit - Clear fluids, IVF  4. Monitoring of labor  - Contractions monitored with external toco - Pelvis adequate for trial of labor  - Plan for expectant management  - Augmentation with oxytocin and AROM as appropriate  - Plan for  continuous fetal monitoring - Maternal pain control as desired; planning regional anesthesia - Anticipate vaginal delivery  5. Post Partum Planning: - Infant feeding: breast  - Contraception: POP's - Tdap vaccine: given  12/20/2020 - Flu vaccine: given 09/27/2020  Gustavo Lah, CNM 03/07/21 7:58 AM  Margaretmary Eddy, CNM Certified Nurse Midwife [facility]

## 2021-03-07 NOTE — Anesthesia Procedure Notes (Signed)
Epidural Patient location during procedure: OB Start time: 03/07/2021 10:48 AM End time: 03/07/2021 10:55 AM  Staffing Anesthesiologist: Alver Fisher, MD Resident/CRNA: Karoline Caldwell, CRNA Performed: resident/CRNA   Preanesthetic Checklist Completed: patient identified, IV checked, site marked, risks and benefits discussed, surgical consent, monitors and equipment checked, pre-op evaluation and timeout performed  Epidural Patient position: sitting Prep: ChloraPrep Patient monitoring: heart rate, continuous pulse ox and blood pressure Approach: midline Location: L3-L4 Injection technique: LOR air  Needle:  Needle type: Tuohy  Needle gauge: 17 G Needle length: 9 cm and 9 Needle insertion depth: 6 cm Catheter type: closed end flexible Catheter size: 19 Gauge Catheter at skin depth: 11 cm Test dose: negative and 1.5% lidocaine with Epi 1:200 K  Assessment Sensory level: T10 Events: blood not aspirated, injection not painful, no injection resistance, no paresthesia and negative IV test  Additional Notes 1 attempt Pt. Evaluated and documentation done after procedure finished. Patient identified. Risks/Benefits/Options discussed with patient including but not limited to bleeding, infection, nerve damage, paralysis, failed block, incomplete pain control, headache, blood pressure changes, nausea, vomiting, reactions to medication both or allergic, itching and postpartum back pain. Confirmed with bedside nurse the patient's most recent platelet count. Confirmed with patient that they are not currently taking any anticoagulation, have any bleeding history or any family history of bleeding disorders. Patient expressed understanding and wished to proceed. All questions were answered. Sterile technique was used throughout the entire procedure. Please see nursing notes for vital signs. Test dose was given through epidural catheter and negative prior to continuing to dose epidural or start  infusion. Warning signs of high block given to the patient including shortness of breath, tingling/numbness in hands, complete motor block, or any concerning symptoms with instructions to call for help. Patient was given instructions on fall risk and not to get out of bed. All questions and concerns addressed with instructions to call with any issues or inadequate analgesia.   Patient tolerated the insertion well without immediate complications.Reason for block:procedure for pain

## 2021-03-07 NOTE — Anesthesia Preprocedure Evaluation (Signed)
Anesthesia Evaluation  Patient identified by MRN, date of birth, ID band Patient awake    Reviewed: Allergy & Precautions, H&P , NPO status , Patient's Chart, lab work & pertinent test results  Airway Mallampati: II  TM Distance: >3 FB Neck ROM: full    Dental  (+) Teeth Intact   Pulmonary neg pulmonary ROS, former smoker,    Pulmonary exam normal        Cardiovascular negative cardio ROS Normal cardiovascular exam     Neuro/Psych negative neurological ROS  negative psych ROS   GI/Hepatic Neg liver ROS, GERD  Medicated and Controlled,  Endo/Other  negative endocrine ROS  Renal/GU negative Renal ROS  negative genitourinary   Musculoskeletal   Abdominal   Peds  Hematology negative hematology ROS (+)   Anesthesia Other Findings   Reproductive/Obstetrics (+) Pregnancy                             Anesthesia Physical Anesthesia Plan  ASA: I  Anesthesia Plan: Epidural   Post-op Pain Management:    Induction:   PONV Risk Score and Plan:   Airway Management Planned:   Additional Equipment:   Intra-op Plan:   Post-operative Plan:   Informed Consent: I have reviewed the patients History and Physical, chart, labs and discussed the procedure including the risks, benefits and alternatives for the proposed anesthesia with the patient or authorized representative who has indicated his/her understanding and acceptance.     Dental Advisory Given  Plan Discussed with: Anesthesiologist and CRNA  Anesthesia Plan Comments:         Anesthesia Quick Evaluation

## 2021-03-07 NOTE — Discharge Summary (Signed)
Obstetrical Discharge Summary  Patient Name: Elizabeth Salazar DOB: 02-18-1996 MRN: [id number]  Date of Admission: 03/07/2021 Date of Delivery: 03/07/21 Delivered by: Heloise Ochoa CNM Date of Discharge: 03/09/2021  Primary OB: [facility] [id number] last menstrual period was 06/08/2020. EDC Estimated Date of Delivery: 03/09/21 Gestational Age at Delivery: [redacted]w[redacted]d   Antepartum complications:  1. RH Neg 2. Remote hx HSV- on prophylaxis  Admitting Diagnosis: active labor, 39wks Secondary Diagnosis: SVD, labial lac  Patient Active Problem List   Diagnosis Date Noted  . NSVD (normal spontaneous vaginal delivery) 03/09/2021  . Acute blood loss anemia 03/09/2021  . 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  Episiotomy: none Newborn Data: Live born female "Elizabeth Salazar" Birth Weight:  3140g 6lb 14.8oz APGAR: 8, 9  Newborn Delivery   Birth date/time: 03/07/2021 18:09:00 Delivery type: Vaginal, Spontaneous     Postpartum Procedures: None  Edinburgh:  Edinburgh Postnatal Depression Scale Screening Tool 03/07/2021  I have been able to laugh and see the funny side of things. 0  I have looked forward with enjoyment to things. 0  I have blamed myself unnecessarily when things went wrong. 1  I have been anxious or worried for no good reason. 1  I have felt scared or panicky for no good reason. 0  Things have been getting on top of me. 0  I have been so unhappy that I have had difficulty sleeping. 0  I have felt sad or miserable. 0  I have been so unhappy that I have been crying. 0  The thought of harming myself has occurred to me. 0  Edinburgh Postnatal Depression Scale Total 2      Post  partum course:  Patient had an uncomplicated postpartum course.  By time of discharge on PPD#2, her pain was controlled on oral pain medications; she had appropriate lochia and was ambulating, voiding without difficulty and tolerating regular diet.  She was deemed stable for discharge to home.    Discharge Physical Exam:  BP 104/74 (BP Location: Right Arm)   Pulse 74   Temp 98.3 F (36.8 C) (Oral)   Resp 18   Ht 5\' 5"  (1.651 m)   Wt 69.4 kg   LMP 06/08/2020   SpO2 100%   Breastfeeding Unknown   BMI 25.46 kg/m   General: NAD CV: RRR Pulm: CTABL, nl effort ABD: s/nd/nt, fundus firm and below the umbilicus Lochia: moderate Perineum: well approximated DVT Evaluation: LE non-ttp, no evidence of DVT on exam.  Hemoglobin  Date Value Ref Range Status  03/08/2021 10.7 (L) 12.0 - 15.0 g/dL Final   HGB  Date Value Ref Range Status  12/27/2013 12.9 (L) 13.0 - 18.0 g/dL Final   HCT  Date Value Ref Range Status  03/08/2021 31.8 (L) 36.0 - 46.0 % Final  12/27/2013 38.3 (L) 40.0 - 52.0 % Final     Disposition: stable, discharge to home. Baby Feeding: breastmilk Baby Disposition: home with mom  Rh Immune globulin given: Baby is A neg Rubella vaccine given: Immune Varicella vaccine given: Immune Tdap vaccine given in AP or PP setting: 12/20/20 Flu vaccine given in AP or PP setting: 09/24/20  Contraception: POPs  Prenatal Labs:  Blood type/Rh A neg  Antibody screen neg  Rubella Immune  Varicella Immune  RPR NR  HBsAg Neg  HIV NR  GC neg  Chlamydia neg  Genetic screening declined  1 hour GTT 133  3 hour GTT   GBS Neg      Plan:  Christianne Merlino was discharged to home in good condition. Follow-up appointment with delivering provider in 6 weeks.  Discharge Medications: Allergies as of 03/09/2021   No Known Allergies     Medication List    STOP taking these medications   acyclovir 400 MG tablet Commonly known as: ZOVIRAX   acyclovir 800 MG tablet Commonly  known as: ZOVIRAX   metroNIDAZOLE 500 MG tablet Commonly known as: FLAGYL     TAKE these medications   ferrous sulfate 325 (65 FE) MG tablet Take 1 tablet (325 mg total) by mouth daily with breakfast.        Follow-up Information    McVey, Prudencio Pair, CNM. Schedule an appointment as soon as possible for a visit in 6 week(s).   Specialty: Obstetrics and Gynecology Why: routine Postpartum Contact information: [address] [place] [address] [phone]               Signed:  Cyril Mourning, CNM 03/09/2021  8:45 AM

## 2021-03-07 NOTE — Progress Notes (Signed)
Labor Progress Note  Elizabeth Salazar is a 25 y.o. G1P0 at [redacted]w[redacted]d by ultrasound admitted for active labor  Subjective: comfortable after epidural  Objective: BP (!) 105/57   Pulse (!) 104   Temp 98.1 F (36.7 C) (Oral)   Resp 16   Ht 5\' 5"  (1.651 m)   Wt 69.4 kg   LMP 06/08/2020   BMI 25.46 kg/m  Notable VS details: reviewed  Fetal Assessment: FHT:  FHR: 135 bpm, variability: moderate,  accelerations:  Present,  decelerations:  Present variable, early Category/reactivity:  Category II UC:   regular, every 2-4 minutes SVE:   7/C/-1 AROM clear fluid at 1508.  Amniotic color: clear   Labs: Lab Results  Component Value Date   WBC 8.8 03/07/2021   HGB 11.9 (L) 03/07/2021   HCT 34.9 (L) 03/07/2021   MCV 90.4 03/07/2021   PLT 214 03/07/2021    Assessment / Plan: Augmentation of labor, progressing well  Labor: Progressing normally Preeclampsia:  no signs or symptoms of toxicity Fetal Wellbeing:  Category II Pain Control:  Epidural I/D:  n/a Anticipated MOD:  NSVD  03/09/2021, CNM 03/07/2021, 6:24 PM

## 2021-03-08 LAB — CBC
HCT: 31.8 % — ABNORMAL LOW (ref 36.0–46.0)
Hemoglobin: 10.7 g/dL — ABNORMAL LOW (ref 12.0–15.0)
MCH: 31 pg (ref 26.0–34.0)
MCHC: 33.6 g/dL (ref 30.0–36.0)
MCV: 92.2 fL (ref 80.0–100.0)
Platelets: 179 10*3/uL (ref 150–400)
RBC: 3.45 MIL/uL — ABNORMAL LOW (ref 3.87–5.11)
RDW: 13.2 % (ref 11.5–15.5)
WBC: 9 10*3/uL (ref 4.0–10.5)
nRBC: 0 % (ref 0.0–0.2)

## 2021-03-08 LAB — TYPE AND SCREEN
ABO/RH(D): A NEG
Antibody Screen: POSITIVE

## 2021-03-08 LAB — RPR: RPR Ser Ql: NONREACTIVE

## 2021-03-08 MED ORDER — IBUPROFEN 600 MG PO TABS
600.0000 mg | ORAL_TABLET | Freq: Four times a day (QID) | ORAL | Status: DC
  Administered 2021-03-08 – 2021-03-09 (×5): 600 mg via ORAL
  Filled 2021-03-08 (×5): qty 1

## 2021-03-08 NOTE — Progress Notes (Signed)
Postpartum Day  1  Subjective: no complaints  Doing well, no concerns. Ambulating without difficulty, pain managed with PO meds, tolerating regular diet, and voiding without difficulty.   No fever/chills, chest pain, shortness of breath, nausea/vomiting, or leg pain. No nipple or breast pain. No headache, visual changes, or RUQ/epigastric pain.  Objective: BP 109/78 (BP Location: Left Arm)   Pulse 74   Temp 97.8 F (36.6 C) (Oral)   Resp 17   Ht 5\' 5"  (1.651 m)   Wt 69.4 kg   LMP 06/08/2020   SpO2 100%   Breastfeeding Unknown   BMI 25.46 kg/m    Physical Exam:  General: alert, cooperative and no distress Breasts: soft/nontender CV: RRR Pulm: nl effort, CTABL Abdomen: soft, non-tender, active bowel sounds Uterine Fundus: firm Perineum: minimal edema, repair well approximated Lochia: appropriate DVT Evaluation: No evidence of DVT seen on physical exam.  Recent Labs    03/07/21 0821 03/08/21 0712  HGB 11.9* 10.7*  HCT 34.9* 31.8*  WBC 8.8 9.0  PLT 214 179    Assessment/Plan: 25 y.o. G1P1001 postpartum day # 1  -Continue routine postpartum care -Lactation consult PRN for breastfeeding  -Acute blood loss anemia - hemodynamically stable and asymptomatic; start PO ferrous sulfate BID with stool softeners  -Immunization status:   all immunizations up to date   Disposition: Continue inpatient postpartum care    LOS: 1 day   30, CNM 03/08/2021, 9:03 AM   ----- 03/10/2021  Certified Nurse Midwife [facility]

## 2021-03-08 NOTE — Lactation Note (Signed)
This note was copied from a baby's chart. Lactation Consultation Note  Patient Name: Elizabeth Salazar CBULA'G Date: 03/08/2021 Reason for consult: Follow-up assessment;1st time breastfeeding;Term;Nipple pain/trauma Age:23 hours      Infant initial seen at 39hrs old. [facility] [employer] [employer] [facility] student completed day one education on what to expect, relating to feeding cues, infant sleeping, and voids. [name] is providing breast and bottle at this time, unsure of what she wanting to do. At time visit, LC student was able to assist with feeding and latch position. [name] reports pain with right nipple and visually had a compression strip with some bleeding. LC explained how to care for nipple and provided coconut oil and comfort gels. Infant latched for , mom then placed infant on right side and stated it felt more comfortable.   [facility] student provided additional information during a follow up, set mom up with DEBP and spoon fed infant back 13ml. Infant is having difficulties with maintaining latch on right side and bottle feeding with formula.  LC encouraged pumping every 3hrs for , infant to feed on demand, and to reach out to lactation services for assistance.  Maternal Data Has patient been taught Hand Expression?: Yes Does the patient have breastfeeding experience prior to this delivery?: No  Feeding Mother's Current Feeding Choice: Breast Milk and Formula Nipple Type: Slow - flow    Lactation Tools Discussed/Used Tools: Pump Breast pump type: Double-Electric Breast Pump Pump Education: Setup, frequency, and cleaning;Milk Storage Reason for Pumping: mom's choice Pumping frequency: q 3 hours  Interventions Interventions: Breast feeding basics reviewed;DEBP (paced bottle feeding)  Discharge    Consult Status Consult Status: Follow-up Date: 03/09/21 Follow-up type: In-patient    Audrea Muscat 03/08/2021, 4:42 PM

## 2021-03-08 NOTE — Lactation Note (Signed)
This note was copied from a baby's chart. Lactation Consultation Note  Patient Name: Elizabeth Salazar [id number] Date: 03/08/2021 Reason for consult: Follow-up assessment;1st time breastfeeding;Term;Nipple pain/trauma Age:23 hours  Lactation called in to assist with feeding attempt. Baby was circumcised this morning and has been disinterested, however showing jitteriness- RN took glucose; 51. RN encouraged feeding at the breast, but mom notes that it is painful, and she "can't do it right now". LC and LC student in room to attempt bottle feeding; Baby did not accept bottle and immediately began gagging when nipple placed at mouth. Baby comforted and soothed and fell asleep.  LC Student set-up pump and educated parents on set-up, use, cleaning, milk storage. Encouraged pumping every 3 hours, and to offer any EBM to baby via curved tip syringe and option for syringe feeding rest of needed volume (formula) via syringe as well. Parents agreeable in feeding plan.   Maternal Data Has patient been taught Hand Expression?: Yes Does the patient have breastfeeding experience prior to this delivery?: No  Feeding Mother's Current Feeding Choice: Breast Milk and Formula Nipple Type: Slow - flow  LATCH Score                    Lactation Tools Discussed/Used Tools: Pump Breast pump type: Double-Electric Breast Pump Pump Education: Setup, frequency, and cleaning;Milk Storage Reason for Pumping: mom's choice Pumping frequency: q 3 hours  Interventions Interventions: Breast feeding basics reviewed;DEBP (paced bottle feeding)  Discharge    Consult Status Consult Status: Follow-up Date: 03/09/21 Follow-up type: In-patient    Danford Bad 03/08/2021, 3:51 PM

## 2021-03-08 NOTE — Anesthesia Postprocedure Evaluation (Signed)
Anesthesia Post Note  Patient: Teacher, English as a foreign language  Procedure(s) Performed: AN AD HOC LABOR EPIDURAL  Patient location during evaluation: Mother Baby Anesthesia Type: Epidural Level of consciousness: awake and alert Pain management: pain level controlled Vital Signs Assessment: post-procedure vital signs reviewed and stable Respiratory status: spontaneous breathing, nonlabored ventilation and respiratory function stable Cardiovascular status: stable Postop Assessment: no headache, no backache and epidural receding Anesthetic complications: no   No complications documented.   Last Vitals:  Vitals:   03/07/21 2307 03/08/21 0334  BP: 103/66 90/62  Pulse: 72 71  Resp: 20 18  Temp: 36.5 C 36.7 C  SpO2: 99% 99%    Last Pain:  Vitals:   03/08/21 0334  TempSrc: Oral  PainSc:                  Rosanne Gutting

## 2021-03-09 DIAGNOSIS — D62 Acute posthemorrhagic anemia: Secondary | ICD-10-CM | POA: Diagnosis not present

## 2021-03-09 MED ORDER — FERROUS SULFATE 325 (65 FE) MG PO TABS: 325.0000 mg | ORAL_TABLET | Freq: Every day | ORAL | 3 refills | Status: DC

## 2021-03-09 NOTE — Lactation Note (Signed)
This note was copied from a baby's chart. Lactation Consultation Note  Patient Name: Boy Alexsandra Shontz BTYOM'[id number] Date: 03/09/2021 Reason for consult: Follow-up assessment Age:23 hours  Lactation follow-up on feeding plan.  Baby is receiving 18mL of formula every 1-2 hours. LC touching base to make sure that baby is hungry, or possibly could take a higher volume.  In speaking with mom, mom provides LC with hunger cues that baby is demonstrating: sucking or bobbing on her chest, clinching fists, turning side to side. LC praised mom for noticing hunger cues, and recommended to continue feeding with cues but trying 64mL next feed- and adding in any EBM she may have received from her pumping sessions.  LC encouraged each pumping session to give any EBM to baby regardless or mix in with formula. Reviewed EBM good for 4-6 hours post pumping, and ability to store if needed, but encouraged to give right away.  LC reviewed pumping plan/routine of every 3 hours, and significance of needing to stay consistent with efforts in order to promote an adequate onset of milk for baby. Parents both verbalize understanding in feeding plan, pumping, and offering of EBM.  Maternal Data Has patient been taught Hand Expression?: Yes  Feeding Mother's Current Feeding Choice: Breast Milk and Formula Nipple Type: Slow - flow  LATCH Score                    Lactation Tools Discussed/Used Tools: Pump Breast pump type: Double-Electric Breast Pump Reason for Pumping: mom's choice  Interventions    Discharge    Consult Status      Danford Bad 03/09/2021, 1:42 PM

## 2021-03-09 NOTE — Lactation Note (Signed)
This note was copied from a baby's chart. Lactation Consultation Note  Patient Name: Elizabeth SalazarU Date: 03/09/2021 Reason for consult: Follow-up assessment Age:25 years        LC student completed follow relating to, infant feedings and bili level concerns. [name] infant reports night feedings were very frequent and length of time at breast 5-42mins. Infant last fed at 7am and additional 75ml at 9am of formula and has been placed on phototherapy. LC educated [name] on the feeding infant every 3hrs, at least 25-30 ml, due to bili levels.   [name] student also discussed pumping plans, [name] reports she pumped once during the night and last pumping session was at 7am for . LC encouraged pumping session should be and occur when infant is getting bottle so breast still have stimulation for milk supply. [name] student set mother up at 10am with pumping, infant is due for feeding at 12-noon.  [name] is to feed infant every 3hrs at least 25-61ml, pump every time infant receives bottle feed, and if BF minimal time at breast 10-18mins and supplement after, due to current phototherapy treatment.   Maternal Data Has patient been taught Hand Expression?: Yes  Feeding Mother's Current Feeding Choice: Breast Milk and Formula Nipple Type: Slow - flow    Consult Status  Follow up inpatient     Elizabeth Salazar 03/09/2021, 10:29 AM

## 2021-03-09 NOTE — Discharge Instructions (Signed)
Postpartum Care After Vaginal Delivery The following information offers guidance about how to care for yourself from the time you deliver your baby to 6-12 weeks after delivery (postpartum period). If you have problems or questions, contact your health care provider for more specific instructions. Follow these instructions at home: Vaginal bleeding  It is normal to have vaginal bleeding (lochia) after delivery. Wear a sanitary pad for bleeding and discharge. ? During the first week after delivery, the amount and appearance of lochia is often similar to a menstrual period. ? Over the next few weeks, it will gradually decrease to a dry, yellow-brown discharge. ? For most women, lochia stops completely by 4-6 weeks after delivery, but can vary.  Change your sanitary pads frequently. Watch for any changes in your flow, such as: ? A sudden increase in volume. ? A change in color. ? Large blood clots.  If you pass a blood clot from your vagina, save it and call your health care provider. Do not flush blood clots down the toilet before talking with your health care provider.  Do not use tampons or douches until your health care provider approves.  If you are not breastfeeding, your period should return 6-8 weeks after delivery. If you are feeding your baby breast milk only, your period may not return until you stop breastfeeding. Perineal care  Keep the area between the vagina and the anus (perineum) clean and dry. Use medicated pads and pain-relieving sprays and creams as directed.  If you had a surgical cut in the perineum (episiotomy) or a tear, check the area for signs of infection until you are healed. Check for: ? More redness, swelling, or pain. ? Fluid or blood coming from the cut or tear. ? Warmth. ? Pus or a bad smell.  You may be given a squirt bottle to use instead of wiping to clean the perineum area after you use the bathroom. Pat the area gently to dry it.  To relieve pain  caused by an episiotomy, a tear, or swollen veins in the anus (hemorrhoids), take a warm sitz bath 2-3 times a day. In a sitz bath, the warm water should only come up to your hips and cover your buttocks.   Breast care  In the first few days after delivery, your breasts may feel heavy, full, and uncomfortable (breast engorgement). Milk may also leak from your breasts. Ask your health care provider about ways to help relieve the discomfort.  If you are breastfeeding: ? Wear a bra that supports your breasts and fits well. Use breast pads to absorb milk that leaks. ? Keep your nipples clean and dry. Apply creams and ointments as told. ? You may have uterine contractions every time you breastfeed for up to several weeks after delivery. This helps your uterus return to its normal size. ? If you have any problems with breastfeeding, notify your health care provider or lactation consultant.  If you are not breastfeeding: ? Avoid touching your breasts. Do not squeeze out (express) milk. Doing this can make your breasts produce more milk. ? Wear a good-fitting bra and use cold packs to help with swelling. Intimacy and sexuality  Ask your health care provider when you can engage in sexual activity. This may depend upon: ? Your risk of infection. ? How fast you are healing. ? Your comfort and desire to engage in sexual activity.  You are able to get pregnant after delivery, even if you have not had your period. Talk with   your health care provider about methods of birth control (contraception) or family planning if you desire future pregnancies. Medicines  Take over-the-counter and prescription medicines only as told by your health care provider.  Take an over-the-counter stool softener to help ease bowel movements as told by your health care provider.  If you were prescribed an antibiotic medicine, take it as told by your health care provider. Do not stop taking the antibiotic even if you start to  feel better.  Review all previous and current prescriptions to check for possible transfer into breast milk. Activity  Gradually return to your normal activities as told by your health care provider.  Rest as much as possible. Nap while your baby is sleeping. Eating and drinking  Drink enough fluid to keep your urine pale yellow.  To help prevent or relieve constipation, eat high-fiber foods every day.  Choose healthy eating to support breastfeeding or weight loss goals.  Take your prenatal vitamins until your health care provider tells you to stop.   General tips/recommendations  Do not use any products that contain nicotine or tobacco. These products include cigarettes, chewing tobacco, and vaping devices, such as e-cigarettes. If you need help quitting, ask your health care provider.  Do not drink alcohol, especially if you are breastfeeding.  Do not take medications or drugs that are not prescribed to you, especially if you are breastfeeding.  Visit your health care provider for a postpartum checkup within the first 3-6 weeks after delivery.  Complete a comprehensive postpartum visit no later than 12 weeks after delivery.  Keep all follow-up visits for you and your baby. Contact a health care provider if:  You feel unusually sad or worried.  Your breasts become red, painful, or hard.  You have a fever or other signs of an infection.  You have bleeding that is soaking through one pad an hour or you have blood clots.  You have a severe headache that doesn't go away or you have vision changes.  You have nausea and vomiting and are unable to eat or drink anything for 24 hours. Get help right away if:  You have chest pain or difficulty breathing.  You have sudden, severe leg pain.  You faint or have a seizure.  You have thoughts about hurting yourself or your baby. If you ever feel like you may hurt yourself or others, or have thoughts about taking your own life,  get help right away. Go to your nearest emergency department or:  Call your local emergency services (911 in the [place]  The National Suicide Prevention Lifeline at 1-800-273-8255. This suicide crisis helpline is open 24 hours a day.  Text the Crisis Text Line at 741741 (in the [place] Summary  The period of time after you deliver your newborn up to 6-12 weeks after delivery is called the postpartum period.  Keep all follow-up visits for you and your baby.  Review all previous and current prescriptions to check for possible transfer into breast milk.  Contact a health care provider if you feel unusually sad or worried during the postpartum period. This information is not intended to replace advice given to you by your health care provider. Make sure you discuss any questions you have with your health care provider. Document Revised: 07/22/2020 Document Reviewed: 07/22/2020 Elsevier Patient Education  2021 Elsevier Inc.   Postpartum Baby Blues The postpartum period begins right after the birth of a baby. During this time, there is often joy and excitement. It is   also a time of many changes in the life of the parents. A mother may feel happy one minute and sad or stressed the next. These feelings of sadness, called the baby blues, usually happen in the period right after the baby is born and go away within a week or two. What are the causes? The exact cause of this condition is not known. Changes in hormone levels after childbirth are believed to trigger some of the symptoms. Other factors that can play a role in these mood changes include:  Lack of sleep.  Stressful life events, such as financial problems, caring for a loved one, or death of a loved one.  Genetics. What are the signs or symptoms? Symptoms of this condition include:  Changes in mood, such as going from extreme happiness to sadness.  A decrease in concentration.  Difficulty sleeping.  Crying spells and  tearfulness.  Loss of appetite.  Irritability.  Anxiety. If these symptoms last for more than 2 weeks or become more severe, you may have postpartum depression. How is this diagnosed? This condition is diagnosed based on an evaluation of your symptoms. Your health care provider may use a screening tool that includes a list of questions to help identify a person with the baby blues or postpartum depression. How is this treated? The baby blues usually go away on their own in 1-2 weeks. Social support is often what is needed. You will be encouraged to get adequate sleep and rest. Follow these instructions at home: Lifestyle  Get as much rest as you can. Take a nap when the baby sleeps.  Exercise regularly as told by your health care provider. Some women find yoga and walking to be helpful.  Eat a balanced and nourishing diet. This includes plenty of fruits and vegetables, whole grains, and lean proteins.  Do little things that you enjoy. Take a bubble bath, read your favorite magazine, or listen to your favorite music.  Avoid alcohol.  Ask for help with household chores, cooking, grocery shopping, or running errands. Do not try to do everything yourself. Consider hiring a postpartum doula to help. This is a professional who specializes in providing support to new mothers.  Try not to make any major life changes during pregnancy or right after giving birth. This can add stress.      General instructions  Talk to people close to you about how you are feeling. Get support from your partner, family members, friends, or other new moms. You may want to join a support group.  Find ways to manage stress. This may include: ? Writing your thoughts and feelings in a journal. ? Spending time outside. ? Spending time with people who make you laugh.  Try to stay positive in how you think. Think about the things you are grateful for.  Take over-the-counter and prescription medicines only as  told by your health care provider.  Let your health care provider know if you have any concerns.  Keep all postpartum visits. This is important. Contact a health care provider if:  Your baby blues do not go away after 2 weeks. Get help right away if:  You have thoughts of taking your own life (suicidal thoughts), or of harming your baby or someone else.  You see or hear things that are not there (hallucinations). If you ever feel like you may hurt yourself or others, or have thoughts about taking your own life, get help right away. Go to your nearest emergency department or:    Call your local emergency services (911 in the [place]  Call a suicide crisis helpline, such as the National Suicide Prevention Lifeline, at 1-800-273-8255. This is open 24 hours a day in the [place]  Text the Crisis Text Line at 741741 (in the [place] Summary  After giving birth, you may feel happy one minute and sad or stressed the next. Feelings of sadness that happen right after the baby is born and go away after a week or two are called the baby blues.  You can manage the baby blues by getting enough rest, eating a healthy diet, exercising, spending time with supportive people, and finding ways to manage stress.  If feelings of sadness and stress last longer than 2 weeks or get in the way of caring for your baby, talk with your health care provider. This may mean you have postpartum depression. This information is not intended to replace advice given to you by your health care provider. Make sure you discuss any questions you have with your health care provider. Document Revised: 04/30/2020 Document Reviewed: 04/30/2020 Elsevier Patient Education  2021 Elsevier Inc.     

## 2021-03-09 NOTE — Progress Notes (Signed)
RN reviewed discharge instructions with patient. RN went over bleeding postpartum, signs and symptoms of infection, breastfeeding/breast care, postpartum blues and medications to take postpartum. All questions answered and patient/family verbalized understanding. Patient discharged in stable condition with father of baby.

## 2021-03-20 DIAGNOSIS — Z419 Encounter for procedure for purposes other than remedying health state, unspecified: Secondary | ICD-10-CM | POA: Diagnosis not present

## 2021-04-20 DIAGNOSIS — Z419 Encounter for procedure for purposes other than remedying health state, unspecified: Secondary | ICD-10-CM | POA: Diagnosis not present

## 2021-04-24 IMAGING — MR MR HEAD WO/W CM
12 of 18 series · 27 of 48 positions shown · IV contrast (4ml Gadavist)
Comparison: None.

CLINICAL DATA: 24-year-old female with hyperprolactinemia. Mild
blurred vision.

EXAM:
MRI HEAD WITHOUT AND WITH CONTRAST
TECHNIQUE: Multiplanar, multiecho pulse sequences of the brain and surrounding
structures were obtained without and with intravenous contrast.
CONTRAST:  7.5mL GADAVIST GADOBUTROL 1 MMOL/ML IV SOLN

[Series 5: T1 · sagittal · 5.0mm · 0.57mm/px · 3 of 23 slices shown]
[im 1/23]
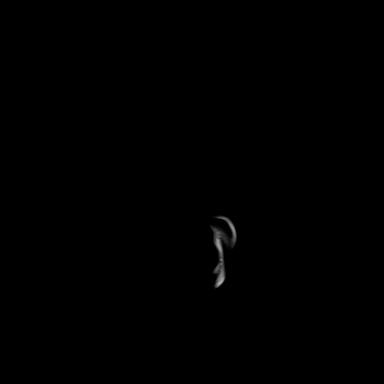
[im 12/23]
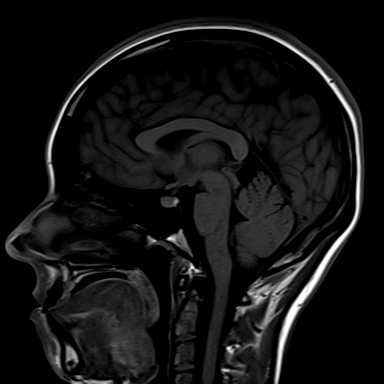
[im 23/23]
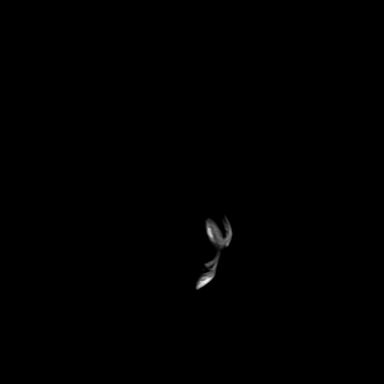

[Series 8: T2 · axial · 5.0mm · 0.53mm/px · z∈[-72,+82]mm · 2 of 27 slices shown]
[im 1/27]
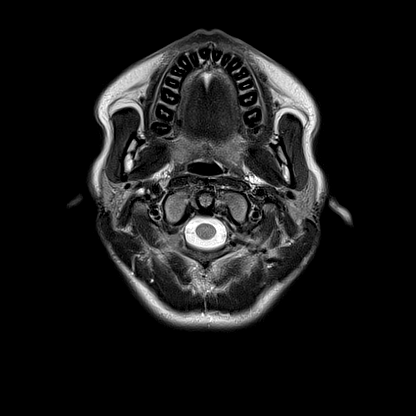
[im 27/27]
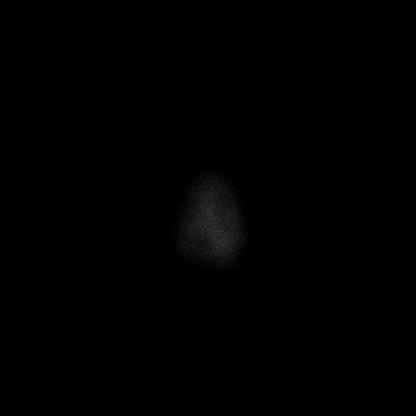

[Series 10: FLAIR · axial · 3.0mm · 0.53mm/px · z∈[-74,+85]mm · 5 of 55 slices shown]
[im 1/55]
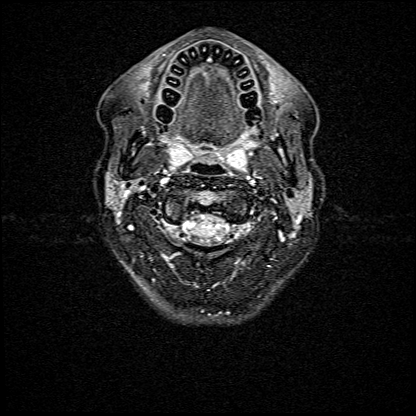
[im 14/55]
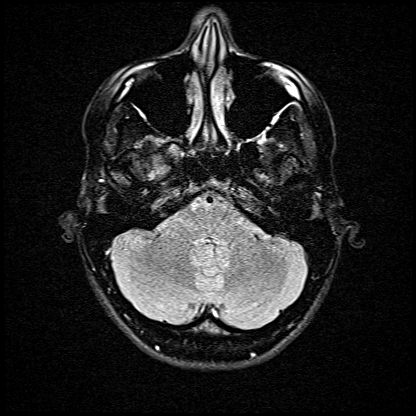
[im 28/55]
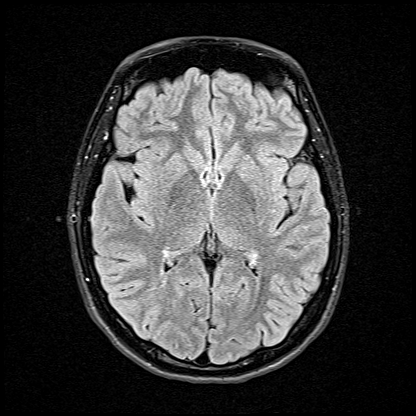
[im 41/55]
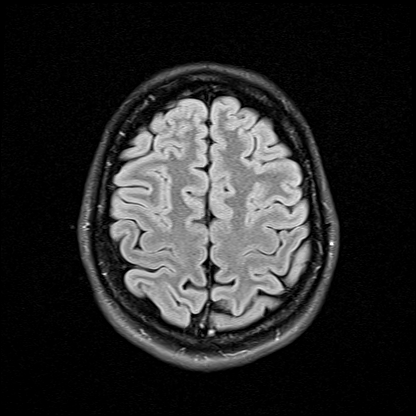
[im 55/55]
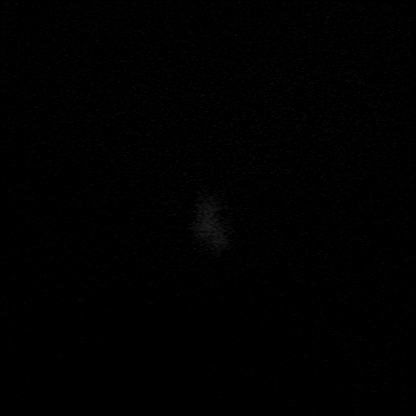

[Series 14: T1 post-contrast · coronal · 3.0mm · 0.28mm/px · 1 of 11 slices shown (1 of 9)]
[im 1/11]
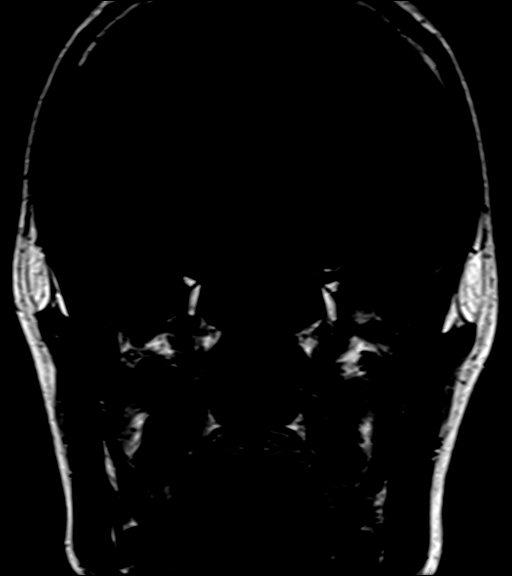

[Series 15: T1 post-contrast · coronal · 3.0mm · 0.28mm/px · 1 of 11 slices shown (2 of 9)]
[im 1/11]
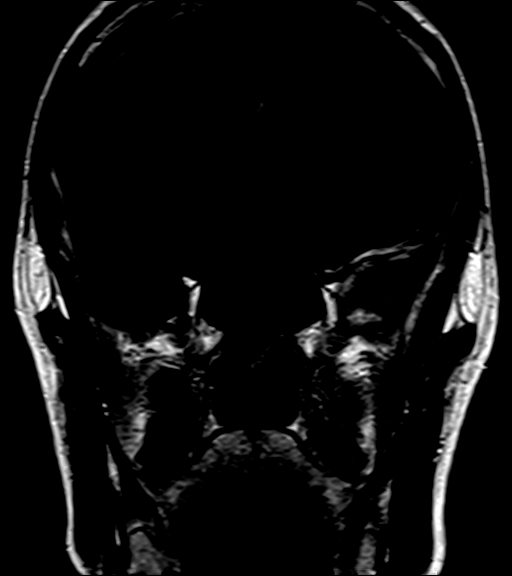

[Series 16: T1 post-contrast · coronal · 3.0mm · 0.28mm/px · 1 of 11 slices shown (3 of 9)]
[im 1/11]
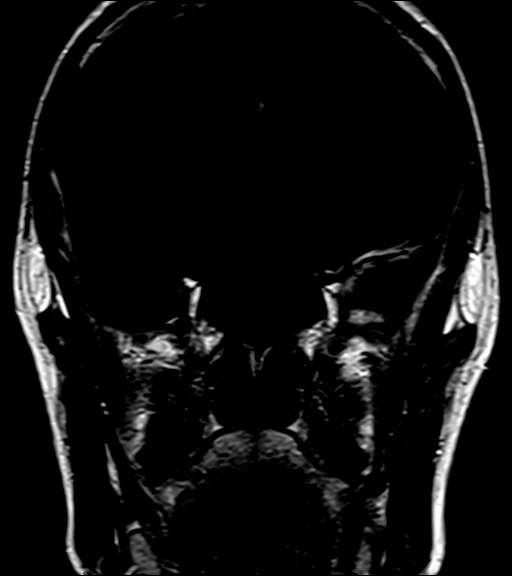

[Series 17: T1 post-contrast · coronal · 3.0mm · 0.28mm/px · 1 of 11 slices shown (4 of 9)]
[im 1/11]
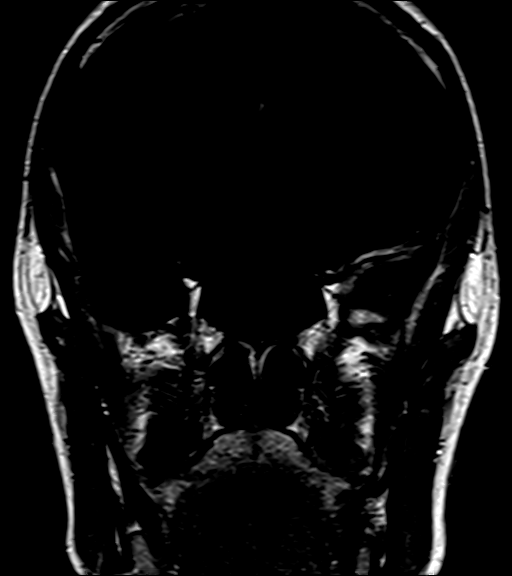

[Series 18: T1 post-contrast · coronal · 3.0mm · 0.28mm/px · 1 of 11 slices shown (5 of 9)]
[im 1/11]
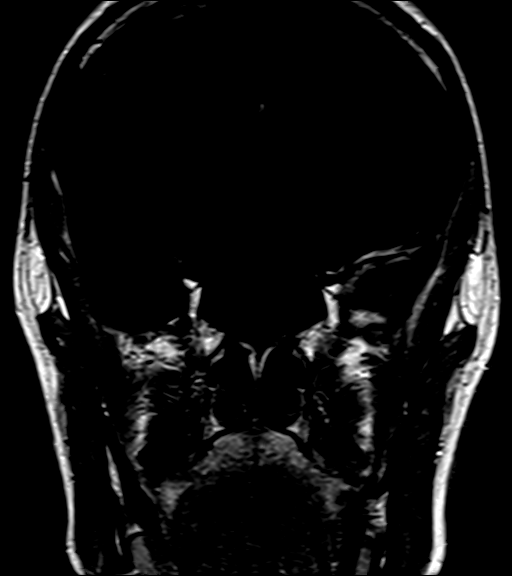

[Series 19: T1 post-contrast · coronal · 3.0mm · 0.21mm/px · 1 of 13 slices shown (6 of 9)]
[im 1/13]
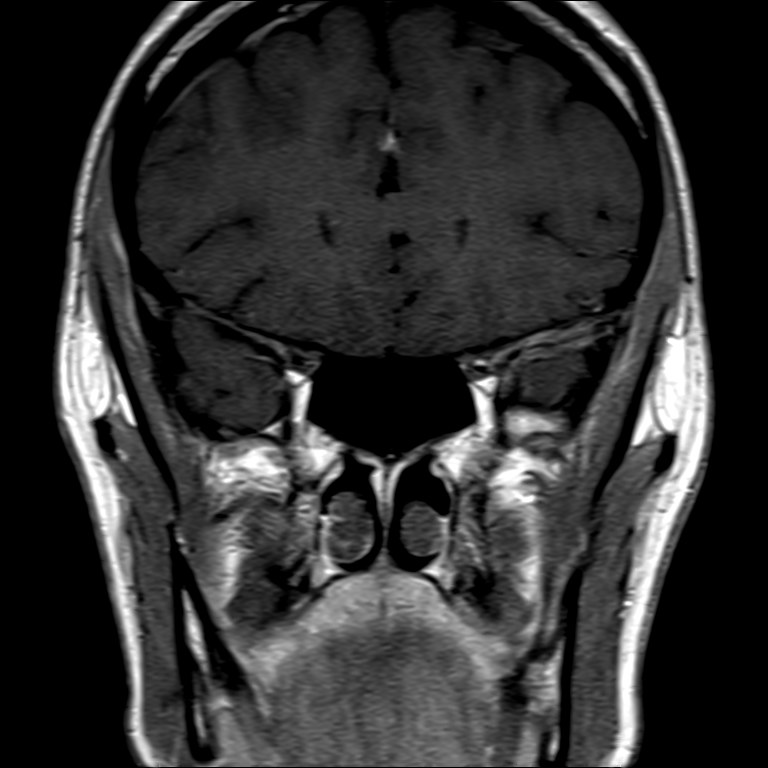

[Series 20: T1 post-contrast · sagittal · 3.0mm · 0.21mm/px · 1 of 13 slices shown (7 of 9)]
[im 1/13]
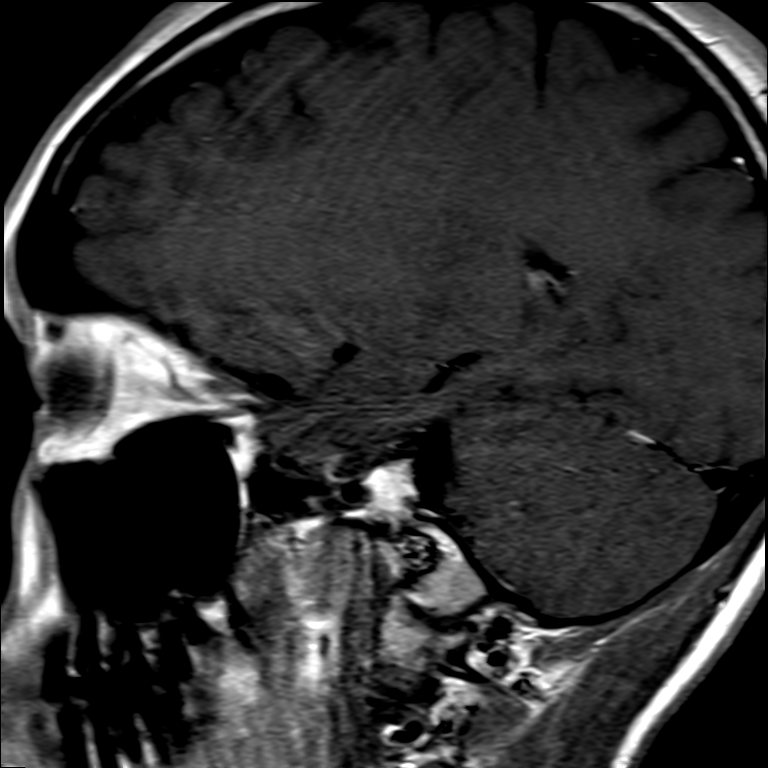

[Series 21: T1 post-contrast · axial · 1.0mm · 0.86mm/px · z∈[-81,+89]mm · 8 of 174 slices shown (8 of 9)]
[im 1/174]
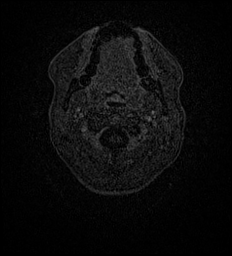
[im 25/174]
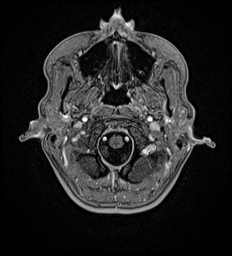
[im 50/174]
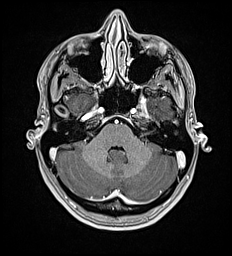
[im 75/174]
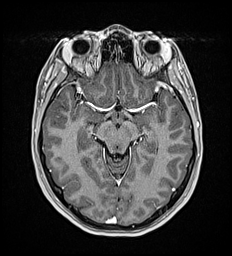
[im 99/174]
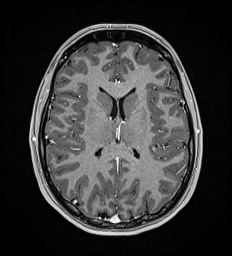
[im 124/174]
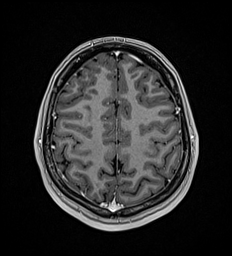
[im 149/174]
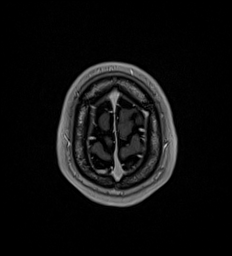
[im 174/174]
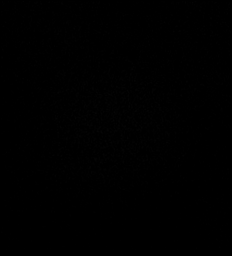

[Series 22: T1 post-contrast · coronal · 5.0mm · 0.57mm/px · 2 of 28 slices shown (9 of 9)]
[im 1/28]
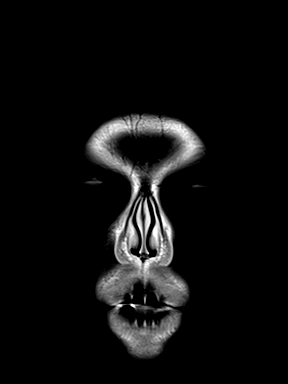
[im 28/28]
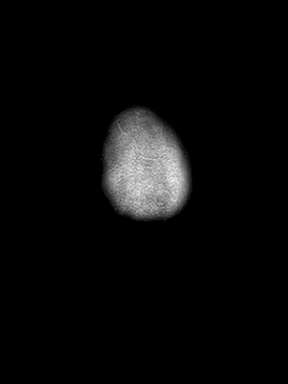

[27 of 48 positions shown; findings below may reference images not displayed]

FINDINGS: Brain: Pituitary details are below. No restricted diffusion to
suggest acute infarction. No midline shift, mass effect, evidence of
mass lesion, ventriculomegaly, extra-axial collection or acute
intracranial hemorrhage. Cervicomedullary junction within normal
limits.

Gray and white matter signal is within normal limits throughout the
brain. No encephalomalacia or chronic cerebral blood products.

No abnormal enhancement identified. Right inferior parietal lobe
developmental venous anomaly (normal variant - series 22, image 8).
No dural thickening.

Vascular: Major intracranial vascular flow voids are preserved.

Skull and upper cervical spine: Negative visible cervical spine.
Normal bone marrow signal.

Sinuses/Orbits: Negative orbits. Paranasal sinuses are clear.
Mastoid air cells are clear. Visible internal auditory structures
appear normal.

Other: Dedicated pituitary imaging. Overall pituitary size and
configuration is normal. Normal suprasellar cistern. Normal
infundibulum. Normal hypothalamus. Normal cavernous sinus.

Dynamic and delayed enhancement of the pituitary gland is within
normal limits. There is no discrete pituitary nodule or mass.
IMPRESSION: Normal pituitary.   Normal MRI appearance of the brain.

## 2021-05-20 DIAGNOSIS — Z419 Encounter for procedure for purposes other than remedying health state, unspecified: Secondary | ICD-10-CM | POA: Diagnosis not present

## 2021-06-20 DIAGNOSIS — Z419 Encounter for procedure for purposes other than remedying health state, unspecified: Secondary | ICD-10-CM | POA: Diagnosis not present

## 2021-07-21 DIAGNOSIS — Z419 Encounter for procedure for purposes other than remedying health state, unspecified: Secondary | ICD-10-CM | POA: Diagnosis not present

## 2021-08-20 DIAGNOSIS — Z419 Encounter for procedure for purposes other than remedying health state, unspecified: Secondary | ICD-10-CM | POA: Diagnosis not present

## 2021-09-20 DIAGNOSIS — Z419 Encounter for procedure for purposes other than remedying health state, unspecified: Secondary | ICD-10-CM | POA: Diagnosis not present

## 2021-10-20 DIAGNOSIS — Z419 Encounter for procedure for purposes other than remedying health state, unspecified: Secondary | ICD-10-CM | POA: Diagnosis not present

## 2021-11-20 DIAGNOSIS — Z419 Encounter for procedure for purposes other than remedying health state, unspecified: Secondary | ICD-10-CM | POA: Diagnosis not present

## 2021-12-21 DIAGNOSIS — Z419 Encounter for procedure for purposes other than remedying health state, unspecified: Secondary | ICD-10-CM | POA: Diagnosis not present

## 2022-01-18 DIAGNOSIS — Z419 Encounter for procedure for purposes other than remedying health state, unspecified: Secondary | ICD-10-CM | POA: Diagnosis not present

## 2022-02-18 DIAGNOSIS — Z419 Encounter for procedure for purposes other than remedying health state, unspecified: Secondary | ICD-10-CM | POA: Diagnosis not present

## 2022-03-20 DIAGNOSIS — Z419 Encounter for procedure for purposes other than remedying health state, unspecified: Secondary | ICD-10-CM | POA: Diagnosis not present

## 2022-04-20 DIAGNOSIS — Z419 Encounter for procedure for purposes other than remedying health state, unspecified: Secondary | ICD-10-CM | POA: Diagnosis not present

## 2022-05-20 DIAGNOSIS — Z419 Encounter for procedure for purposes other than remedying health state, unspecified: Secondary | ICD-10-CM | POA: Diagnosis not present

## 2022-06-20 DIAGNOSIS — Z419 Encounter for procedure for purposes other than remedying health state, unspecified: Secondary | ICD-10-CM | POA: Diagnosis not present

## 2022-07-21 DIAGNOSIS — Z419 Encounter for procedure for purposes other than remedying health state, unspecified: Secondary | ICD-10-CM | POA: Diagnosis not present

## 2022-08-20 DIAGNOSIS — Z419 Encounter for procedure for purposes other than remedying health state, unspecified: Secondary | ICD-10-CM | POA: Diagnosis not present

## 2022-09-20 DIAGNOSIS — Z419 Encounter for procedure for purposes other than remedying health state, unspecified: Secondary | ICD-10-CM | POA: Diagnosis not present

## 2022-10-20 DIAGNOSIS — Z419 Encounter for procedure for purposes other than remedying health state, unspecified: Secondary | ICD-10-CM | POA: Diagnosis not present

## 2022-11-20 DIAGNOSIS — Z419 Encounter for procedure for purposes other than remedying health state, unspecified: Secondary | ICD-10-CM | POA: Diagnosis not present

## 2022-12-21 DIAGNOSIS — Z419 Encounter for procedure for purposes other than remedying health state, unspecified: Secondary | ICD-10-CM | POA: Diagnosis not present

## 2023-01-19 DIAGNOSIS — Z419 Encounter for procedure for purposes other than remedying health state, unspecified: Secondary | ICD-10-CM | POA: Diagnosis not present

## 2023-02-19 DIAGNOSIS — Z419 Encounter for procedure for purposes other than remedying health state, unspecified: Secondary | ICD-10-CM | POA: Diagnosis not present

## 2023-03-21 DIAGNOSIS — Z419 Encounter for procedure for purposes other than remedying health state, unspecified: Secondary | ICD-10-CM | POA: Diagnosis not present

## 2023-04-21 DIAGNOSIS — Z419 Encounter for procedure for purposes other than remedying health state, unspecified: Secondary | ICD-10-CM | POA: Diagnosis not present

## 2023-05-21 DIAGNOSIS — Z419 Encounter for procedure for purposes other than remedying health state, unspecified: Secondary | ICD-10-CM | POA: Diagnosis not present

## 2023-06-21 DIAGNOSIS — Z419 Encounter for procedure for purposes other than remedying health state, unspecified: Secondary | ICD-10-CM | POA: Diagnosis not present

## 2023-07-22 DIAGNOSIS — Z419 Encounter for procedure for purposes other than remedying health state, unspecified: Secondary | ICD-10-CM | POA: Diagnosis not present

## 2023-08-06 ENCOUNTER — Other Ambulatory Visit: Payer: Self-pay

## 2023-08-06 DIAGNOSIS — O418X10 Other specified disorders of amniotic fluid and membranes, first trimester, not applicable or unspecified: Secondary | ICD-10-CM | POA: Insufficient documentation

## 2023-08-06 DIAGNOSIS — O468X1 Other antepartum hemorrhage, first trimester: Secondary | ICD-10-CM | POA: Insufficient documentation

## 2023-08-09 ENCOUNTER — Encounter: Payer: Self-pay | Admitting: Physician Assistant

## 2023-08-09 ENCOUNTER — Ambulatory Visit: Payer: Commercial Managed Care - PPO | Admitting: Physician Assistant

## 2023-08-09 DIAGNOSIS — B3731 Acute candidiasis of vulva and vagina: Secondary | ICD-10-CM

## 2023-08-09 DIAGNOSIS — Z113 Encounter for screening for infections with a predominantly sexual mode of transmission: Secondary | ICD-10-CM

## 2023-08-09 LAB — WET PREP FOR TRICH, YEAST, CLUE: Trichomonas Exam: NEGATIVE

## 2023-08-09 MED ORDER — CLOTRIMAZOLE 1 % VA CREA
1.0000 | TOPICAL_CREAM | Freq: Every day | VAGINAL | Status: DC
Start: 2023-08-09 — End: 2023-08-13

## 2023-08-09 NOTE — Progress Notes (Signed)
Here today for STD screening. Accepts bloodwork. Tawny Hopping, RN  [facility] reviewed by A. Streilein, PA-C. Treated for Yeast per verbal and standing orders. Tawny Hopping, RN

## 2023-08-09 NOTE — Progress Notes (Signed)
[facility] Department  STI clinic/screening visit [address] [place] [address] [phone]  Subjective:  Elizabeth Salazar is a 27 y.o. female being seen today for an STI screening visit. The patient reports they do have symptoms.  Patient reports that they do not desire a pregnancy in the next year.   They reported they are not interested in discussing contraception today.    Patient's last menstrual period was 07/14/2023 (exact date).  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 Wants STI eval without bloodwork. Remote h/o genital HSV and chlamydia. On COCPs, last pack.    Patient reports sx as notes on STI Flowsheet.  Does the patient using douching products? No  Last HIV test per patient/review of record was  Lab Results  Component Value Date   HMHIVSCREEN Negative - Validated 07/04/2018    Lab Results  Component Value Date   HIV Non-reactive 02/09/2021   Patient reports last pap was No results found for: "DIAGPAP" No results found for: "SPECADGYN"  Screening for MPX risk: Does the patient have an unexplained rash? No Is the patient MSM? No Does the patient endorse multiple sex partners or anonymous sex partners? No Did the patient have close or sexual contact with a person diagnosed with MPX? No Has the patient traveled outside the [place] where MPX is endemic? No Is there a high clinical suspicion for MPX-- evidenced by one of the following  No  -Unlikely to be chickenpox  -Lymphadenopathy  -Rash that present in same phase of evolution on any given body part See flowsheet for further details and programmatic requirements.   Immunization history:  Immunization History  Administered Date(s) Administered   Influenza,inj,Quad PF,6+ Mos 09/27/2020   Tdap 12/20/2020     The following portions of the patient's history were reviewed and updated as appropriate: allergies, current medications, past medical history, past social history, past surgical history and problem list.  Objective:  There were no vitals filed for this visit.  Physical Exam Vitals and nursing note reviewed. Exam conducted with a chaperone present Tawny Hopping, RN).  Constitutional:      Appearance: Normal appearance. She is normal weight.  HENT:     Head: Normocephalic and atraumatic.     Mouth/Throat:     Mouth: Mucous membranes are moist.     Pharynx: Oropharynx is clear. No oropharyngeal exudate or posterior oropharyngeal erythema.  Pulmonary:     Effort: Pulmonary effort is normal.  Abdominal:     General: Abdomen is flat.     Palpations: There is no mass.     Tenderness: There is no abdominal tenderness. There is no rebound.  Genitourinary:    General: Normal vulva.     Exam position: Lithotomy position.     Pubic Area: No rash or pubic lice.      Labia:        Right: No rash or lesion.        Left: No rash  or lesion.      Vagina: Vaginal discharge present. No erythema, bleeding or lesions.     Cervix: No cervical motion tenderness, discharge, friability, lesion or erythema.     Uterus: Normal.      Adnexa: Right adnexa normal and left adnexa normal.     Rectum: Normal.     Comments: pH = 4.0 Lymphadenopathy:     Head:     Right side of head: No preauricular or posterior auricular adenopathy.     Left side of head: No preauricular or posterior auricular adenopathy.     Cervical: No cervical adenopathy.     Upper Body:     Right upper  body: No supraclavicular, axillary or epitrochlear adenopathy.     Left upper body: No supraclavicular, axillary or epitrochlear adenopathy.     Lower Body: No right inguinal adenopathy. No left inguinal adenopathy.  Skin:    General: Skin is warm and dry.     Findings: No rash.  Neurological:     Mental Status: She is alert and oriented to person, place, and time.    Assessment and Plan:  Elizabeth Salazar is a 27 y.o. female presenting to the [facility] Department for STI testing with recent vaginal sx.  1. Routine screening for STI (sexually transmitted infection) Treat wet prep if needed per S.O. Await other test results. Schedule well-woman exam. Continue COCPs until RV. - Chlamydia/Gonorrhea  Lab - WET PREP FOR TRICH, YEAST, CLUE  2. Yeast vaginitis Treat vaginal yeast per S.O. - clotrimazole (CLOTRIMAZOLE-7) 1 % vaginal cream; Place 1 Applicatorful vaginally at bedtime for 7 days.  Patient accepted all screenings including vaginal CT/GC and wet prep. Patient meets criteria for HepB screening? No. Ordered? not applicable Patient meets criteria for HepC screening? No. Ordered? not applicable  Treat wet prep per standing order Discussed time line for State Lab results and that patient will be called with positive results and encouraged patient to call if she had not heard in 2 weeks.  Counseled to return or seek care for continued or worsening symptoms Recommended repeat testing in 3 months with positive results. Recommended condom use with all sex  Patient is currently using  COCPs  to prevent pregnancy.    Return in about 4 weeks (around 09/06/2023) for Annual well-woman exam.  Future Appointments  Date Time Provider Department Center  08/13/2023  9:45 AM Celso Amy, PA-C [facility] None  09/03/2023  8:35 AM [facility] PROVIDER [facility] None    Landry Dyke, PA-C

## 2023-08-13 ENCOUNTER — Encounter: Payer: Self-pay | Admitting: Physician Assistant

## 2023-08-13 ENCOUNTER — Ambulatory Visit (INDEPENDENT_AMBULATORY_CARE_PROVIDER_SITE_OTHER): Payer: BC Managed Care – PPO | Admitting: Physician Assistant

## 2023-08-13 VITALS — BP 103/67 | HR 94 | Temp 98.2°F | Ht 65.0 in | Wt 117.6 lb

## 2023-08-13 DIAGNOSIS — R197 Diarrhea, unspecified: Secondary | ICD-10-CM | POA: Diagnosis not present

## 2023-08-13 DIAGNOSIS — R1084 Generalized abdominal pain: Secondary | ICD-10-CM

## 2023-08-13 DIAGNOSIS — R140 Abdominal distension (gaseous): Secondary | ICD-10-CM | POA: Diagnosis not present

## 2023-08-13 DIAGNOSIS — D649 Anemia, unspecified: Secondary | ICD-10-CM

## 2023-08-13 DIAGNOSIS — K5900 Constipation, unspecified: Secondary | ICD-10-CM

## 2023-08-13 DIAGNOSIS — Z862 Personal history of diseases of the blood and blood-forming organs and certain disorders involving the immune mechanism: Secondary | ICD-10-CM

## 2023-08-13 NOTE — Progress Notes (Signed)
Elizabeth Amy, PA-C [address]  [address]  [place], [place] [address]  Main: [phone]  Fax: [phone]   Gastroenterology Consultation  Referring Provider:     No ref. provider found Primary Care Physician:  Patient, No Pcp Per Primary Gastroenterologist:  Elizabeth Amy, PA-C  Reason for Consultation:     Abdominal pain, gas, diarrhea, constipation        HPI:   Elizabeth Salazar is a 27 y.o. y/o female is self referred for consultation & management.    Patient states she has had chronic GI symptoms since the age of 73.  In the past 5 years she has had worsening increased intestinal gas, episodes of diarrhea alternating with constipation.  She has generalized upper abdominal cramping 1 or 2 hours after eating.  It is relieved with passing gas.  Most recently she has had no bowel movement for 5 or 6 days.  Started OTC stool softener and OTC probiotic in the past week with some benefit.  She had a normal formed bowel movement last night.  She denies unintentional weight loss or rectal bleeding.  She has had hemorrhoids symptoms since giving birth to her son 2 and half years ago.  No recent hemorrhoids.  She has history of anemia diagnosed 2 years ago attributed to menorrhagia.  Has taken OTC iron pill intermittently as needed.  Not currently on iron.  She is wondering if she is allergic to certain foods.  No recent labs.  No previous GI evaluation   History reviewed. No pertinent past medical history.  History reviewed. No pertinent surgical history.  Prior to Admission medications   Medication Sig Start Date End Date Taking? Authorizing Provider  clotrimazole (CLOTRIMAZOLE-7) 1 % vaginal cream Place 1 Applicatorful vaginally at bedtime for 7 days. 08/09/23 08/16/23  Landry Dyke, PA-C  ferrous sulfate 325 (65 FE) MG tablet Take 1 tablet (325 mg total) by mouth daily with breakfast. 03/09/21   Haroldine Laws, CNM    Family History  Problem Relation Age of Onset    Healthy Mother    Healthy Father      Social History   Tobacco Use   Smoking status: Former    Current packs/day: 0.00    Types: Cigarettes    Quit date: 03/03/2018    Years since quitting: 5.4   Smokeless tobacco: Never   Tobacco comments:    social smoker  Vaping Use   Vaping status: Former  Substance Use Topics   Alcohol use: Yes    Comment: socially   Drug use: Never    Allergies as of 08/13/2023   (No Known Allergies)    Review of Systems:    All systems reviewed and negative except where noted in HPI.   Physical Exam:  BP 103/67   Pulse 94   Temp 98.2 F (36.8 C)   Ht 5\' 5"  (1.651 m)   Wt 117 lb 9.6 oz (53.3 kg)   LMP 07/14/2023 (Exact Date)   BMI 19.57 kg/m  Patient's last menstrual period was 07/14/2023 (exact date).  General:   Alert,  Well-developed, well-nourished, pleasant and cooperative in NAD Lungs:  Respirations even and unlabored.  Clear throughout to auscultation.   No wheezes, crackles, or rhonchi. No acute distress. Heart:  Regular rate and rhythm; no murmurs, clicks, rubs, or gallops. Abdomen:  Normal bowel sounds.  No bruits.  Soft, and non-distended without masses, hepatosplenomegaly or hernias noted.  No Tenderness.  No guarding or rebound tenderness.  Neurologic:  Alert and oriented x3;  grossly normal neurologically. Psych:  Alert and cooperative. Normal mood and affect.  Imaging Studies: No results found.  Assessment and Plan:   Elizabeth Salazar is a 27 y.o. y/o female is self referred for gas, diarrhea alternating with constipation and generalized abdominal pain.  Chronic intermittent GI symptoms for over 5 years.  Suspicious for irritable bowel syndrome.  Food allergies, celiac, and IBD are also in the differential.  I am ordering lab work and stool test for further evaluation.  Also giving trial of treatment with follow-up.  1.  Diarrhea  Stool test: fecal calprotectin to check for IBD.  Lab: Celiac and food allergy  panel  2.  Constipation  Lab: TSH  Start OTC MiraLAX powder mix 1 capful in a drink once daily.  Start OTC Benefiber powder mix 1 to 2 tablespoons in a drink once daily.  Continue OTC stool softener if needed.  3.  Generalized abdominal cramping  Lab: CBC and CMP  4.  Intestinal gas  Recommend a low FODMAP diet.  Continue OTC probiotic daily.  5.  History of anemia attributed to menorrhagia  Not currently on iron  Labs: CBC, iron panel, ferritin, B12, folate, and celiac lab.    Follow up in 4 weeks with TG.  Elizabeth Amy, PA-C

## 2023-08-19 LAB — COMPREHENSIVE METABOLIC PANEL
ALT: 14 [IU]/L (ref 0–32)
AST: 19 [IU]/L (ref 0–40)
Albumin: 4.2 g/dL (ref 4.0–5.0)
Alkaline Phosphatase: 45 [IU]/L (ref 44–121)
BUN/Creatinine Ratio: 13 (ref 9–23)
BUN: 10 mg/dL (ref 6–20)
Bilirubin Total: 0.3 mg/dL (ref 0.0–1.2)
CO2: 23 mmol/L (ref 20–29)
Calcium: 9.2 mg/dL (ref 8.7–10.2)
Chloride: 102 mmol/L (ref 96–106)
Creatinine, Ser: 0.75 mg/dL (ref 0.57–1.00)
Globulin, Total: 2.8 g/dL (ref 1.5–4.5)
Glucose: 88 mg/dL (ref 70–99)
Potassium: 3.8 mmol/L (ref 3.5–5.2)
Sodium: 139 mmol/L (ref 134–144)
Total Protein: 7 g/dL (ref 6.0–8.5)
eGFR: 113 mL/min/{1.73_m2} (ref >59)

## 2023-08-19 LAB — B12 AND FOLATE PANEL
Folate: 13.3 ng/mL (ref >3.0)
Vitamin B-12: 382 pg/mL (ref 232–1245)

## 2023-08-19 LAB — FOOD ALLERGY PROFILE

## 2023-08-19 LAB — CELIAC DISEASE AB SCREEN W/RFX
Antigliadin Abs, IgA: 8 U (ref 0–19)
IgA/Immunoglobulin A, Serum: 221 mg/dL (ref 87–352)

## 2023-08-19 LAB — CBC
Hematocrit: 38.7 % (ref 34.0–46.6)
Hemoglobin: 12.7 g/dL (ref 11.1–15.9)
MCH: 31.1 pg (ref 26.6–33.0)
MCHC: 32.8 g/dL (ref 31.5–35.7)
MCV: 95 fL (ref 79–97)
Platelets: 250 10*3/uL (ref 150–450)
RBC: 4.09 x10E6/uL (ref 3.77–5.28)
RDW: 12.4 % (ref 11.7–15.4)
WBC: 5.1 10*3/uL (ref 3.4–10.8)

## 2023-08-19 LAB — TSH: TSH: 1 u[IU]/mL (ref 0.450–4.500)

## 2023-08-19 LAB — IRON,TIBC AND FERRITIN PANEL
Ferritin: 43 ng/mL (ref 15–150)
Iron Saturation: 35 % (ref 15–55)
Iron: 139 ug/dL (ref 27–159)
Total Iron Binding Capacity: 393 ug/dL (ref 250–450)
UIBC: 254 ug/dL (ref 131–425)

## 2023-08-19 LAB — C-REACTIVE PROTEIN: CRP: 2 mg/L (ref 0–10)

## 2023-08-19 LAB — CALPROTECTIN, FECAL: Calprotectin, Fecal: 28 ug/g (ref 0–120)

## 2023-08-21 DIAGNOSIS — Z419 Encounter for procedure for purposes other than remedying health state, unspecified: Secondary | ICD-10-CM | POA: Diagnosis not present

## 2023-08-27 ENCOUNTER — Ambulatory Visit: Payer: Commercial Managed Care - PPO

## 2023-09-03 ENCOUNTER — Ambulatory Visit (LOCAL_COMMUNITY_HEALTH_CENTER): Payer: Medicaid Other | Admitting: Family Medicine

## 2023-09-03 ENCOUNTER — Ambulatory Visit: Payer: Commercial Managed Care - PPO

## 2023-09-03 ENCOUNTER — Encounter: Payer: Self-pay | Admitting: Family Medicine

## 2023-09-03 ENCOUNTER — Ambulatory Visit: Payer: Medicaid Other

## 2023-09-03 VITALS — BP 102/72 | HR 67 | Ht 66.0 in | Wt 118.8 lb

## 2023-09-03 DIAGNOSIS — Z0000 Encounter for general adult medical examination without abnormal findings: Secondary | ICD-10-CM

## 2023-09-03 DIAGNOSIS — Z3009 Encounter for other general counseling and advice on contraception: Secondary | ICD-10-CM

## 2023-09-03 DIAGNOSIS — B009 Herpesviral infection, unspecified: Secondary | ICD-10-CM

## 2023-09-03 DIAGNOSIS — Z309 Encounter for contraceptive management, unspecified: Secondary | ICD-10-CM | POA: Diagnosis not present

## 2023-09-03 MED ORDER — NORETHINDRONE 0.35 MG PO TABS: 1.0000 | ORAL_TABLET | Freq: Every day | ORAL | 12 refills | Status: DC

## 2023-09-03 MED ORDER — ACYCLOVIR 400 MG PO TABS
800.0000 mg | ORAL_TABLET | Freq: Two times a day (BID) | ORAL | Status: AC
Start: 2023-09-03 — End: 2023-09-08

## 2023-09-03 NOTE — Progress Notes (Addendum)
Pt is here for PE and BC. Rx ro OCP's sent to pt's pharmacy by Provider.  FP packet given.  Berdie Ogren, RN

## 2023-09-03 NOTE — Progress Notes (Signed)
[facility] DEPARTMENT [facility] [address] Main Number: [phone]  Family Planning Visit- Repeat Yearly Visit  Subjective:  Elizabeth Salazar is a 27 y.o. G1P1001  being seen today for an annual wellness visit and to discuss contraception options.   The patient is currently using Oral Contraceptive for pregnancy prevention. Patient does not want a pregnancy in the next year.    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  Chief Complaint  Patient presents with   Annual Exam    PE/BC pills    Patient reports to clinic for PE and [facility]  Patient denies concerns about self   See flowsheet for other program required questions.   Body mass index is 19.17 kg/m. - Patient is eligible for diabetes screening based on BMI> 25 and age >35?  no HA1C ordered? not applicable  Patient reports 1 of partners in last year. Desires STI screening?  No - done 3 weeks ago   Has patient been screened once for HCV in the past?  No  No results found for: "HCVAB"  Does the patient have current of drug use, have a partner with drug use, and/or has been incarcerated since last result? No  If yes-- Screen for HCV through [facility]   Does the patient meet criteria for HBV testing? No  Criteria:  -Household, sexual or needle sharing contact with HBV -History of drug use -HIV positive -Those with known Hep C   Health Maintenance Due  Topic Date Due   COVID-19 Vaccine (1) Never done   HPV VACCINES (1 - 3-dose series) Never done   Hepatitis C Screening  Never done   Cervical Cancer Screening (Pap smear)  Never done   INFLUENZA VACCINE  06/21/2023    Review of Systems  Constitutional:  Negative for weight loss.  Eyes:  Negative for blurred vision.  Respiratory:  Negative for cough and shortness of breath.    Cardiovascular:  Negative for claudication.  Gastrointestinal:  Negative for nausea.  Genitourinary:  Negative for dysuria and frequency.  Skin:  Negative for rash.  Neurological:  Negative for headaches.  Endo/Heme/Allergies:  Does not bruise/bleed easily.    The following portions of the patient's history were reviewed and updated as appropriate: allergies, current medications, past family history, past medical history, past social history, past surgical history and problem list. Problem list updated.  Objective:   Vitals:   09/03/23 0846  BP: 102/72  Pulse: 67  Weight: 118 lb 12.8 oz (53.9 kg)  Height: 5\' 6"  (1.676 m)    Physical Exam Constitutional:      Appearance: Normal appearance.  HENT:     Head: Normocephalic and atraumatic.  Pulmonary:     Effort: Pulmonary effort is normal.  Abdominal:     Palpations: Abdomen is soft.  Musculoskeletal:        General: Normal range of motion.  Skin:    General: Skin is warm and dry.  Neurological:     General: No focal deficit present.     Mental Status: She is alert.  Psychiatric:        Mood and Affect: Mood normal.        Behavior: Behavior normal.       Assessment and Plan:  Elizabeth Salazar is a 27 y.o. female G1P1001 presenting to the [facility]  Department for an yearly wellness and contraception visit  1. Family planning Contraception counseling: Reviewed options based on patient desire and reproductive life plan. Patient is interested in Oral Contraceptive. This was provided to the patient today.   Risks, benefits, and typical effectiveness rates were reviewed.  Questions were answered.  Written information was also given to the patient to review.    The patient will follow up in  1 years for surveillance.  The patient was told to call with any further questions, or with any concerns about this method of contraception.  Emphasized use of condoms 100% of the time for STI prevention.  Educated on  ECP and assessed need for ECP. Not indicated- currently on menses  - norethindrone (ORTHO MICRONOR) 0.35 MG tablet; Take 1 tablet (0.35 mg total) by mouth daily.  Dispense: 28 tablet; Refill: 12  2. 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     Return in about 1 year (around 09/02/2024) for annual well-woman exam.  Future Appointments  Date Time Provider Department Center  09/17/2023  9:15 AM Celso Amy, PA-C [facility] None    Lenice Llamas, [place]

## 2023-09-17 ENCOUNTER — Ambulatory Visit: Payer: BC Managed Care – PPO | Admitting: Physician Assistant

## 2023-09-21 DIAGNOSIS — Z419 Encounter for procedure for purposes other than remedying health state, unspecified: Secondary | ICD-10-CM | POA: Diagnosis not present

## 2023-10-21 DIAGNOSIS — Z419 Encounter for procedure for purposes other than remedying health state, unspecified: Secondary | ICD-10-CM | POA: Diagnosis not present

## 2023-11-21 DIAGNOSIS — Z419 Encounter for procedure for purposes other than remedying health state, unspecified: Secondary | ICD-10-CM | POA: Diagnosis not present

## 2023-11-21 NOTE — L&D Delivery Note (Signed)
 Delivery Note  Elizabeth Salazar is a G2P1001 at [redacted]w[redacted]d, Patient's last menstrual period was 12/19/2023 (exact date)., consistent with US  at [redacted]w[redacted]d. Estimated Date of Delivery: 09/24/24   First Stage: Labor onset: 1200 Augmentation: None Analgesia Holli intrapartum: Epidural SROM at 005 GBS: negative  Second Stage: Complete dilation at 0055 Onset of pushing at 0055 FHR second stage 140 bpm with moderate variability, variable decels with pushing   Elizabeth Salazar presented to L&D in active labor. She was expectantly managed. She progressed well to C/C/+2 with a strong urge to push.  She pushed effectively over approximately 30 minutes for a spontaneous vaginal birth. Rotation of fetal head from LOP to ROA prior to crowning.  Delivery of a viable baby boy on 09/18/2024 at 0121 by CNM Delivery of fetal head in OA position with restitution to ROT. No nuchal cord;  Anterior then posterior shoulders delivered easily with gentle downward traction. Baby placed on mom's chest, and attended to by baby RN Cord double clamped after cessation of pulsation, cut by father of baby.  Cord blood sample collection: Yes A NEG  Third Stage: Oxytocin  bolus started after delivery of infant for hemorrhage prophylaxis  Placenta delivered via Keren mechanism intact with 3 VC @ 0128 Placenta disposition: discarded  Uterine tone firm / bleeding small  Laceration: none Anesthesia for repair: N/A Suture: N/A Est. Blood Loss (mL): 300 ml   Complications: None  Mom to postpartum.  Baby to Couplet care / Skin to Skin.  Newborn: Information for the patient's newborn:  Elizabeth Salazar, Elizabeth Salazar [[id number]]  Live born female Nola Birth Weight:  pending  APGAR: 8,9   Newborn Delivery   Birth date/time: 09/18/2024 01:21:00 Delivery type: Vaginal, Spontaneous    Feeding planned: breast feeding  ---------- Therisa Pillow, CNM Certified Nurse Midwife [place]  [facility]

## 2023-12-22 DIAGNOSIS — Z419 Encounter for procedure for purposes other than remedying health state, unspecified: Secondary | ICD-10-CM | POA: Diagnosis not present

## 2024-01-19 DIAGNOSIS — Z419 Encounter for procedure for purposes other than remedying health state, unspecified: Secondary | ICD-10-CM | POA: Diagnosis not present

## 2024-01-29 ENCOUNTER — Encounter

## 2024-02-29 DIAGNOSIS — Z3480 Encounter for supervision of other normal pregnancy, unspecified trimester: Secondary | ICD-10-CM | POA: Insufficient documentation

## 2024-03-01 DIAGNOSIS — Z419 Encounter for procedure for purposes other than remedying health state, unspecified: Secondary | ICD-10-CM | POA: Diagnosis not present

## 2024-03-10 DIAGNOSIS — O219 Vomiting of pregnancy, unspecified: Secondary | ICD-10-CM | POA: Diagnosis not present

## 2024-03-10 LAB — OB RESULTS CONSOLE RUBELLA ANTIBODY, IGM: Rubella: IMMUNE

## 2024-03-10 LAB — OB RESULTS CONSOLE VARICELLA ZOSTER ANTIBODY, IGG: Varicella: IMMUNE

## 2024-03-10 LAB — OB RESULTS CONSOLE HEPATITIS B SURFACE ANTIGEN: Hepatitis B Surface Ag: NEGATIVE

## 2024-03-31 DIAGNOSIS — Z419 Encounter for procedure for purposes other than remedying health state, unspecified: Secondary | ICD-10-CM | POA: Diagnosis not present

## 2024-04-08 DIAGNOSIS — Z6791 Unspecified blood type, Rh negative: Secondary | ICD-10-CM | POA: Insufficient documentation

## 2024-05-01 DIAGNOSIS — Z419 Encounter for procedure for purposes other than remedying health state, unspecified: Secondary | ICD-10-CM | POA: Diagnosis not present

## 2024-05-06 DIAGNOSIS — O4442 Low lying placenta NOS or without hemorrhage, second trimester: Secondary | ICD-10-CM | POA: Insufficient documentation

## 2024-05-31 DIAGNOSIS — Z419 Encounter for procedure for purposes other than remedying health state, unspecified: Secondary | ICD-10-CM | POA: Diagnosis not present

## 2024-07-01 DIAGNOSIS — Z419 Encounter for procedure for purposes other than remedying health state, unspecified: Secondary | ICD-10-CM | POA: Diagnosis not present

## 2024-07-02 LAB — OB RESULTS CONSOLE HIV ANTIBODY (ROUTINE TESTING): HIV: NONREACTIVE

## 2024-07-02 LAB — OB RESULTS CONSOLE RPR: RPR: NONREACTIVE

## 2024-08-01 DIAGNOSIS — Z419 Encounter for procedure for purposes other than remedying health state, unspecified: Secondary | ICD-10-CM | POA: Diagnosis not present

## 2024-09-01 LAB — OB RESULTS CONSOLE GBS: GBS: NEGATIVE

## 2024-09-01 LAB — OB RESULTS CONSOLE GC/CHLAMYDIA
Chlamydia: NEGATIVE
Neisseria Gonorrhea: NEGATIVE

## 2024-09-15 NOTE — Progress Notes (Signed)
 Obstetrics & Gynecology Office Visit  Subjective  Elizabeth Salazar is a 28 y.o. G2P1001 at [redacted]w[redacted]d being seen today for ongoing prenatal care.  She is currently monitored for tthis high-risk pregnancy.  Patient's last menstrual period was 12/19/2023 (exact date). Estimated Date of Delivery: 09/24/24  History of Present Illness   Patient concerns today: cramping and diarrhea this morning around 1 am.  Pt denies contractions, vaginal bleeding, leaking fluid. Endorses good fetal movementfrequent Pt denies HA, VD or RUQ pain.   Objective  BP 130/85   Pulse 105   Ht 165.1 cm (5' 5)   Wt 73.7 kg (162 lb 6.4 oz)   LMP 12/19/2023 (Exact Date)   BMI 27.02 kg/m    Fetal Status:          Pre-pregnant weight: 54.4 kg (120 lb)  TWG: 19.2 kg (42 lb 6.4 oz)  Gen: NAD  Pulm: No use of accessory muscles, normal respirations Abdomen: Gravid, nontender Ext : no edema, no rashes.   Psych: Mood, insight, judgement intact SVE: deferred FHT by doppler: 155 bpm distinguished from mom Fundal height: 36 cm S=D  Assessment   28 y.o. G2P1001 at 104w5d by  09/24/2024, Date entered prior to episode creation presenting for routine prenatal visit  Plan   Problem list reviewed and/or updated   Assessment & Plan     Orders Placed This Encounter  Procedures  . Comprehensive Metabolic Panel (CMP)    Release to patient:   Immediate  . CBC w/auto Differential (5 Part)    Release to patient:   Immediate  . Prot+CreatU (Random) - [facility]    Release to patient:   Immediate    1. Supervision of high risk pregnancy in third trimester (HHS-HCC) -PE Chaperone note: A chaperone was included for sensitive portions of the exam- staff member name: D. Corson CMA Chaperone present for pelvic exam.  - GBS results: neg - Labor plans reviewed: placed on IOL list  - Kick counts reviewed with the patient in detail.  Patient instructed to assess for fetal activity daily at regular intervals. Counts to be done  if decreased activity noted. Patient instructed to contact the office if counts do not reveal adequate movements.  - Labor precautions (ROM, vaginal bleeding, signs and symptoms of labor), along with how and when to call the office, reviewed. Patient verbalized understanding.  - Return in about 1 week (around 09/22/2024) for Routine PNC.    2. Rh negative antepartum [O28.899] -rhogam given at 28 weeks  3. HSV-2 infection -valtrex started at 36 weeks    FKC reviewed.  Pre-term labor precautions reviewed.  and Call with bleeding, contractions, PROM, or decreased fetal movement.   Return in about 1 week (around 09/22/2024) for Routine PNC.   Attestation Statement:   I personally performed the service, non-incident to. (WP)   FELICIA L DICKERSON, CNM

## 2024-09-17 ENCOUNTER — Inpatient Hospital Stay
Admission: EM | Admit: 2024-09-17 | Discharge: 2024-09-19 | DRG: 807 | Disposition: A | Discharge status: Home / Self Care

## 2024-09-17 ENCOUNTER — Other Ambulatory Visit: Payer: Self-pay

## 2024-09-17 ENCOUNTER — Observation Stay
Admission: EM | Admit: 2024-09-17 | Discharge: 2024-09-17 | Disposition: A | Discharge status: Home / Self Care | Source: Home / Self Care | Admitting: Obstetrics and Gynecology

## 2024-09-17 ENCOUNTER — Encounter: Payer: Self-pay | Admitting: Obstetrics and Gynecology

## 2024-09-17 DIAGNOSIS — B009 Herpesviral infection, unspecified: Secondary | ICD-10-CM | POA: Diagnosis present

## 2024-09-17 DIAGNOSIS — O471 False labor at or after 37 completed weeks of gestation: Secondary | ICD-10-CM | POA: Insufficient documentation

## 2024-09-17 DIAGNOSIS — Z3A39 39 weeks gestation of pregnancy: Secondary | ICD-10-CM

## 2024-09-17 DIAGNOSIS — O9832 Other infections with a predominantly sexual mode of transmission complicating childbirth: Secondary | ICD-10-CM | POA: Diagnosis present

## 2024-09-17 DIAGNOSIS — Z8249 Family history of ischemic heart disease and other diseases of the circulatory system: Secondary | ICD-10-CM | POA: Diagnosis not present

## 2024-09-17 DIAGNOSIS — Z833 Family history of diabetes mellitus: Secondary | ICD-10-CM | POA: Diagnosis not present

## 2024-09-17 DIAGNOSIS — Z6791 Unspecified blood type, Rh negative: Secondary | ICD-10-CM

## 2024-09-17 DIAGNOSIS — A6000 Herpesviral infection of urogenital system, unspecified: Secondary | ICD-10-CM | POA: Diagnosis present

## 2024-09-17 DIAGNOSIS — Z87891 Personal history of nicotine dependence: Secondary | ICD-10-CM

## 2024-09-17 DIAGNOSIS — R7309 Other abnormal glucose: Secondary | ICD-10-CM | POA: Diagnosis present

## 2024-09-17 DIAGNOSIS — O26893 Other specified pregnancy related conditions, third trimester: Principal | ICD-10-CM | POA: Diagnosis present

## 2024-09-17 DIAGNOSIS — O479 False labor, unspecified: Principal | ICD-10-CM | POA: Diagnosis present

## 2024-09-17 LAB — CBC
HCT: 36.4 % (ref 36.0–46.0)
Hemoglobin: 12.4 g/dL (ref 12.0–15.0)
MCH: 31 pg (ref 26.0–34.0)
MCHC: 34.1 g/dL (ref 30.0–36.0)
MCV: 91 fL (ref 80.0–100.0)
Platelets: 186 K/uL (ref 150–400)
RBC: 4 MIL/uL (ref 3.87–5.11)
RDW: 13 % (ref 11.5–15.5)
WBC: 11 K/uL — ABNORMAL HIGH (ref 4.0–10.5)
nRBC: 0 % (ref 0.0–0.2)

## 2024-09-17 MED ORDER — PHENYLEPHRINE 80 MCG/ML (10ML) SYRINGE FOR IV PUSH (FOR BLOOD PRESSURE SUPPORT): 80.0000 ug | PREFILLED_SYRINGE | INTRAVENOUS | Status: DC | PRN

## 2024-09-17 MED ORDER — SOD CITRATE-CITRIC ACID 500-334 MG/5ML PO SOLN: 30.0000 mL | ORAL | Status: DC | PRN

## 2024-09-17 MED ORDER — LIDOCAINE HCL (PF) 1 % IJ SOLN: 30.0000 mL | INTRAMUSCULAR | Status: DC | PRN

## 2024-09-17 MED ORDER — TRANEXAMIC ACID-NACL 1000-0.7 MG/100ML-% IV SOLN
INTRAVENOUS | Status: AC
  Filled 2024-09-17: qty 100

## 2024-09-17 MED ORDER — EPHEDRINE 5 MG/ML INJ: 10.0000 mg | INTRAVENOUS | Status: DC | PRN

## 2024-09-17 MED ORDER — OXYTOCIN BOLUS FROM INFUSION: 333.0000 mL | Freq: Once | INTRAVENOUS | Status: AC

## 2024-09-17 MED ORDER — LACTATED RINGERS IV SOLN: 500.0000 mL | INTRAVENOUS | Status: DC | PRN

## 2024-09-17 MED ORDER — OXYTOCIN-SODIUM CHLORIDE 30-0.9 UT/500ML-% IV SOLN
INTRAVENOUS | Status: AC
  Administered 2024-09-18: 333 mL via INTRAVENOUS
  Filled 2024-09-17: qty 500

## 2024-09-17 MED ORDER — SODIUM CHLORIDE 0.9% FLUSH: 3.0000 mL | INTRAVENOUS | Status: DC | PRN

## 2024-09-17 MED ORDER — SODIUM CHLORIDE 0.9% FLUSH: 3.0000 mL | Freq: Two times a day (BID) | INTRAVENOUS | Status: DC

## 2024-09-17 MED ORDER — SODIUM CHLORIDE 0.9 % IV SOLN: 250.0000 mL | INTRAVENOUS | Status: DC | PRN

## 2024-09-17 MED ORDER — FENTANYL-BUPIVACAINE-NACL 0.5-0.125-0.9 MG/250ML-% EP SOLN
12.0000 mL/h | EPIDURAL | Status: DC | PRN
  Administered 2024-09-18: 12 mL/h via EPIDURAL
  Filled 2024-09-17: qty 250

## 2024-09-17 MED ORDER — LACTATED RINGERS IV SOLN
500.0000 mL | Freq: Once | INTRAVENOUS | Status: AC
  Administered 2024-09-18: 500 mL via INTRAVENOUS

## 2024-09-17 MED ORDER — FENTANYL CITRATE (PF) 100 MCG/2ML IJ SOLN: 50.0000 ug | INTRAMUSCULAR | Status: DC | PRN

## 2024-09-17 MED ORDER — MISOPROSTOL 200 MCG PO TABS
ORAL_TABLET | ORAL | Status: AC
  Filled 2024-09-17: qty 4

## 2024-09-17 MED ORDER — ACETAMINOPHEN 500 MG PO TABS: 1000.0000 mg | ORAL_TABLET | Freq: Four times a day (QID) | ORAL | Status: DC | PRN

## 2024-09-17 MED ORDER — ONDANSETRON HCL 4 MG/2ML IJ SOLN: 4.0000 mg | Freq: Four times a day (QID) | INTRAMUSCULAR | Status: DC | PRN

## 2024-09-17 MED ORDER — LACTATED RINGERS IV SOLN: INTRAVENOUS | Status: DC

## 2024-09-17 MED ORDER — DIPHENHYDRAMINE HCL 50 MG/ML IJ SOLN: 12.5000 mg | INTRAMUSCULAR | Status: DC | PRN

## 2024-09-17 MED ORDER — METHYLERGONOVINE MALEATE 0.2 MG/ML IJ SOLN
INTRAMUSCULAR | Status: AC
  Filled 2024-09-17: qty 1

## 2024-09-17 MED ORDER — CALCIUM CARBONATE ANTACID 500 MG PO CHEW: 2.0000 | CHEWABLE_TABLET | ORAL | Status: DC | PRN

## 2024-09-17 MED ORDER — LIDOCAINE HCL (PF) 1 % IJ SOLN
INTRAMUSCULAR | Status: AC
  Filled 2024-09-17: qty 30

## 2024-09-17 MED ORDER — AMMONIA AROMATIC IN INHA
RESPIRATORY_TRACT | Status: AC
  Filled 2024-09-17: qty 10

## 2024-09-17 MED ORDER — OXYTOCIN-SODIUM CHLORIDE 30-0.9 UT/500ML-% IV SOLN: 2.5000 [IU]/h | INTRAVENOUS | Status: DC

## 2024-09-17 MED ORDER — OXYTOCIN 10 UNIT/ML IJ SOLN
INTRAMUSCULAR | Status: AC
  Filled 2024-09-17: qty 1

## 2024-09-17 NOTE — Discharge Summary (Signed)
 Elizabeth Salazar is a 28 y.o. female. She is at [redacted]w[redacted]d gestation. Patient's last menstrual period was 12/19/2023 (exact date). 09/24/2024, by Last Menstrual Period   Prenatal care site: [facility]  Chief complaint: contractions   Admission Diagnoses:  1) intrauterine pregnancy at [redacted]w[redacted]d  2) Uterine contractions during pregnancy [O47.9]  Discharge Diagnoses:  Principal Problem:   Uterine contractions during pregnancy   HPI: Elizabeth Salazar presents to L&D with complaints of contractions that started around 1200 today.  Her pregnancy is uncomplicated.  She denies Loss of fluid or Vaginal bleeding. Endorses fetal movement as active.   S: Resting comfortably.  no VB.no LOF,  Active fetal movement.   Maternal Medical History:  Past Medical Hx:  has no past medical history on file.    Past Surgical Hx:  has no past surgical history on file.   No Known Allergies   Prior to Admission medications   Medication Sig Start Date End Date Taking? Authorizing Provider  acyclovir  (ZOVIRAX ) 200 MG capsule Take 200 mg by mouth daily.   Yes [provider]  ferrous sulfate  325 (65 FE) MG tablet Take 1 tablet (325 mg total) by mouth daily with breakfast. Patient not taking: Reported on 09/03/2023 03/09/21   Myron Nest, CNM    Social History: She  reports that she quit smoking about 6 years ago. Her smoking use included cigarettes. She has never used smokeless tobacco. She reports that she does not currently use alcohol after a past usage of about 1.0 standard drink of alcohol per week. She reports that she does not currently use drugs after having used the following drugs: Marijuana.  Family History: family history includes Diabetes in her maternal grandmother and paternal grandfather; Healthy in her brother, brother, father, mother, and paternal grandmother; Heart disease in her maternal grandfather; Heart failure in her maternal grandmother.   Review of Systems: A full review of  systems was performed and negative except as noted in the HPI.     Pertinent Results:   O:  BP 122/87   Pulse (!) 109   Temp 98.4 F (36.9 C) (Oral)   Resp 16   Ht 5' 5 (1.651 m)   Wt 73.5 kg   LMP 12/19/2023 (Exact Date)   BMI 26.96 kg/m  No results found for this or any previous visit (from the past 48 hours).  No results found.  Constitutional: NAD, AAOx3  PULM: nl respiratory effort Abd: gravid, non-tender Ext: Non-tender, Nonedmeatous Psych: mood appropriate, speech normal SVE: Dilation: 3 Effacement (%): 80 Cervical Position: Middle Station: -2 Exam by:: A Sefora Tietje CNM  - Unchanged after > 2 hours  NST: Baseline FHR: 120 beats/min Variability: moderate Accelerations: present Decelerations: absent Tocometry: 5-7 minutes  Time: at least 20 minutes   Interpretation: Category I INDICATIONS: rule out uterine contractions RESULTS:  A NST procedure was performed with FHR monitoring and a normal baseline established, appropriate time of 20-40 minutes of evaluation, and accels >2 seen w 15x15 characteristics.  Results show a REACTIVE NST.   Consults: None  Procedures: NST  Hospital Course: The patient was admitted to Labor and Delivery Triage for observation. Cervical exam was unchanged after 2 hours. Risk/benefits of membrane sweep discussed and [name] consented. Tolerated well. Reviewed labor precautions and when to return to L&D.   She was deemed stable for discharge and further outpatient management.   Discharge Condition: stable  Disposition: Discharge disposition: 01-Home or Self Care       Allergies as of 09/17/2024  No Known Allergies      Medication List     TAKE these medications    acyclovir  200 MG capsule Commonly known as: ZOVIRAX  Take 200 mg by mouth daily.   ferrous sulfate  325 (65 FE) MG tablet Take 1 tablet (325 mg total) by mouth daily with breakfast.        Follow-up Information     [facility]. Schedule an  appointment as soon as possible for a visit in 5 day(s).   Why: routine prenatal appointment Contact information: [address] [place]  [address] [phone]               ----- [name], CNM  Certified Nurse Midwife [place]  [facility]

## 2024-09-17 NOTE — H&P (Cosign Needed Addendum)
 OB History & Physical   History of Present Illness:   Chief Complaint: contractions  HPI:  Elizabeth Salazar is a 28 y.o. G2P1001 female at [redacted]w[redacted]d, Patient's last menstrual period was 12/19/2023 (exact date)., consistent with US  at [redacted]w[redacted]d, with Estimated Date of Delivery: 09/24/24.  She presents to L&D for worsening contractions. She was seen in [facility] triage earlier today and discharged home when cervical exam was unchanged. A membrane sweep was performed prior to discharge. She reports contractions became progressively more intense. Her pregnancy is complicated by RH negative status and hx of HSV-2 infection.  She denies Loss of fluid. Endorses fetal movement as active.   Reports active fetal movement  Contractions: every 3 to 5 minutes, started at 1200 LOF/SROM: denies  Vaginal bleeding: bloody show  Factors complicating pregnancy:  Principal Problem:   Normal labor Active Problems:   HSV-2 infection   Rh negative, antepartum    Prenatal care site:  [facility]  Patient Active Problem List   Diagnosis Date Noted   Normal labor 09/17/2024   Rh negative, antepartum 04/08/2024   Supervision of other normal pregnancy, antepartum 02/29/2024   HSV-2 infection 07/04/2018      Maternal Diabetes: No Genetic Screening: Normal Maternal Ultrasounds/Referrals: Normal Fetal Ultrasounds or other Referrals:  None Maternal Substance Abuse:  No Significant Maternal Medications:  None Significant Maternal Lab Results:  Group B Strep negative Number of Prenatal Visits:greater than 3 verified prenatal visits Maternal Vaccinations:TDap Other Comments:  None   Maternal Medical History:  History reviewed. No pertinent past medical history.  History reviewed. No pertinent surgical history.  No Known Allergies  Prior to Admission medications   Medication Sig Start Date End Date Taking? Authorizing Provider  acyclovir  (ZOVIRAX ) 200 MG capsule Take 200 mg by mouth daily.    [provider]  ferrous sulfate  325 (65 FE) MG tablet Take 1 tablet (325 mg total) by mouth daily with breakfast. Patient not taking: Reported on 09/03/2023 03/09/21   Myron Nest, CNM    OB History  Gravida Para Term Preterm AB Living  2 1 1  0 0 1  SAB IAB Ectopic Multiple Live Births  0 0 0 0 1    # Outcome Date GA Lbr Len/2nd Weight Sex Type Anes PTL Lv  2 Current           1 Term 03/07/21 [redacted]w[redacted]d 18:26 / 00:12 3140 g M Vag-Spont EPI  LIV     Name: Gruenhagen,BOY Kyleeann     Apgar1: 8  Apgar5: 9     Social History: She  reports that she quit smoking about 6 years ago. Her smoking use included cigarettes. She has never used smokeless tobacco. She reports that she does not currently use alcohol after a past usage of about 1.0 standard drink of alcohol per week. She reports that she does not currently use drugs after having used the following drugs: Marijuana.  Family History: family history includes Diabetes in her maternal grandmother and paternal grandfather; Healthy in her brother, brother, father, mother, and paternal grandmother; Heart disease in her maternal grandfather; Heart failure in her maternal grandmother.   Review of Systems: A full review of systems was performed and negative except as noted in the HPI.     Physical Exam:  Vital Signs: LMP 12/19/2023 (Exact Date)   General: no acute distress.  HEENT: normocephalic, atraumatic Heart: regular rate & rhythm Lungs: normal respiratory effort Abdomen: soft, gravid, non-tender;  EFW: 7 1/2 lbs  Pelvic:  External: Normal external female genitalia  Cervix: Dilation: 7 / Effacement (%): 80 / Station: -2    Extremities: non-tender, symmetric, no edema bilaterally.    Neurologic: Alert & oriented x 3.    No results found for this or any previous visit (from the past 24 hours).  Pertinent Results:  Prenatal Labs: Blood type/Rh A NEG Performed at [facility], [address] [place], [place] [address]     Antibody screen Negative   Rubella Immune    Varicella Immune  RPR NR    HBsAg Neg   Hep C NR   HIV Neg    GC neg  Chlamydia neg  Genetic screening cfDNA negative   1 hour GTT 148  3 hour GTT [phone]   GBS Negative      FHT:  FHR: 135 bpm, variability: moderate,  accelerations:  Present,  decelerations:  Absent Category/reactivity:  Category I UC:   regular, every 2-3 minutes   Cephalic by Leop[place] and SVE   No results found.  Assessment:  Jasey Cortez is a 28 y.o. G2P1001 female at [redacted]w[redacted]d with normal labor.   Plan:  1. Admit to Labor & Delivery - Admission status: Inpatient - Dr. CANDIE Mace MD notified of admission and plan of care  - Reason for admission: labor management - consents reviewed and obtained  2. Fetal Well being  - Fetal Tracing: Cat 1 - Group B Streptococcus ppx not indicated: GBS negative - Presentation: cephalic confirmed by SVE   3. Routine OB: - Prenatal labs reviewed, as above - Rh negative, Rhogam given 07/02/2024 - CBC, T&S, RPR on admit - Clear liquid diet , continuous IV fluids  4. Monitoring of labor  - Contractions monitored with external toco - Pelvis proven to 3140 grams, adequate for trial of labor  - Plan for expectant management  - Augmentation with oxytocin  and AROM as appropriate  - Plan for  continuous fetal monitoring - Maternal pain control as desired; planning regional anesthesia - Anticipate vaginal delivery  5. Post Partum Planning: - Infant feeding: breast feeding and expressed breast milk - Contraception: oral progesterone-only contraceptive - Flu vaccine: declined  - Tdap vaccine: Given prenatally - RSV vaccine: declined   [name], CNM 09/17/24 11:21 PM  Therisa Pillow, CNM Certified Nurse Midwife [place]  [facility]

## 2024-09-17 NOTE — OB Triage Note (Signed)
 Discharge instructions, labor precautions, and follow-up care reviewed with patient and significant other. All questions answered. Patient verbalized understanding. Discharged ambulatory off unit.

## 2024-09-17 NOTE — OB Triage Note (Signed)
 Patient is a G2P1001 at [redacted]w[redacted]d who presents to unit c/o contractions q80min apart since last night. Reports having some bloody show. Reports +fetal movement, denies leakage of fluid. External monitors applied and assessing. Initial FHT 135. Vital signs WDL.

## 2024-09-17 NOTE — Discharge Instructions (Signed)
LABOR: When contractions begin, you should start to time them from the beginning of one contraction to the beginning of the next.  When contractions are 5-10 minutes apart or less and have been regular for at least an hour, you should call your health care provider. ° °Notify your doctor if any of the following occur: °1. Bleeding from the vagina 7. Sudden, constant, or occasional abdominal pain  °2. Pain or burning when urinating 8. Sudden gushing of fluid from the vagina (with or without continued leaking)  °3. Chills or fever 9. Fainting spells, "black outs" or loss of consciousness  °4. Increase in vaginal discharge 10. Severe or continued nausea or vomiting  °5. Pelvic pressure (sudden increase) 11. Blurring of vision or spots before the eyes  °6. Baby moving less than usual 12. Leaking of fluid  ° ° °FETAL KICK COUNT: °Lie on your left side for one hour after a meal, and count the number of times your baby kicks. If it is less than 5 times, get up, move around and drink some juice. Repeat the test 30 minutes later. If it is still less than 5 kicks in an hour, notify your doctor. °

## 2024-09-18 ENCOUNTER — Inpatient Hospital Stay: Admitting: Anesthesiology

## 2024-09-18 ENCOUNTER — Encounter: Payer: Self-pay | Admitting: Obstetrics and Gynecology

## 2024-09-18 LAB — CBC
HCT: 31.9 % — ABNORMAL LOW (ref 36.0–46.0)
Hemoglobin: 11.2 g/dL — ABNORMAL LOW (ref 12.0–15.0)
MCH: 32 pg (ref 26.0–34.0)
MCHC: 35.1 g/dL (ref 30.0–36.0)
MCV: 91.1 fL (ref 80.0–100.0)
Platelets: 166 K/uL (ref 150–400)
RBC: 3.5 MIL/uL — ABNORMAL LOW (ref 3.87–5.11)
RDW: 13.2 % (ref 11.5–15.5)
WBC: 12.8 K/uL — ABNORMAL HIGH (ref 4.0–10.5)
nRBC: 0 % (ref 0.0–0.2)

## 2024-09-18 LAB — TYPE AND SCREEN: Antibody Screen: POSITIVE

## 2024-09-18 LAB — RPR: RPR Ser Ql: NONREACTIVE

## 2024-09-18 MED ORDER — PRENATAL MULTIVITAMIN CH
1.0000 | ORAL_TABLET | Freq: Every day | ORAL | Status: DC
  Administered 2024-09-18 – 2024-09-19 (×2): 1 via ORAL
  Filled 2024-09-18 (×2): qty 1

## 2024-09-18 MED ORDER — SENNOSIDES-DOCUSATE SODIUM 8.6-50 MG PO TABS
2.0000 | ORAL_TABLET | Freq: Every day | ORAL | Status: DC
  Administered 2024-09-19: 2 via ORAL
  Filled 2024-09-18: qty 2

## 2024-09-18 MED ORDER — BUPIVACAINE HCL (PF) 0.25 % IJ SOLN
INTRAMUSCULAR | Status: DC | PRN
  Administered 2024-09-18 (×2): 5 mL via EPIDURAL

## 2024-09-18 MED ORDER — DIPHENHYDRAMINE HCL 25 MG PO CAPS: 25.0000 mg | ORAL_CAPSULE | Freq: Four times a day (QID) | ORAL | Status: DC | PRN

## 2024-09-18 MED ORDER — SODIUM CHLORIDE 0.9% FLUSH
3.0000 mL | Freq: Two times a day (BID) | INTRAVENOUS | Status: DC
  Administered 2024-09-18: 3 mL via INTRAVENOUS

## 2024-09-18 MED ORDER — SODIUM CHLORIDE 0.9% FLUSH: 3.0000 mL | INTRAVENOUS | Status: DC | PRN

## 2024-09-18 MED ORDER — SIMETHICONE 80 MG PO CHEW: 80.0000 mg | CHEWABLE_TABLET | ORAL | Status: DC | PRN

## 2024-09-18 MED ORDER — WITCH HAZEL-GLYCERIN EX PADS
1.0000 | MEDICATED_PAD | CUTANEOUS | Status: DC | PRN
  Administered 2024-09-18: 1 via TOPICAL
  Filled 2024-09-18: qty 100

## 2024-09-18 MED ORDER — ONDANSETRON HCL 4 MG PO TABS: 4.0000 mg | ORAL_TABLET | ORAL | Status: DC | PRN

## 2024-09-18 MED ORDER — LIDOCAINE-EPINEPHRINE (PF) 1.5 %-1:200000 IJ SOLN
INTRAMUSCULAR | Status: DC | PRN
  Administered 2024-09-18: 3 mL via PERINEURAL

## 2024-09-18 MED ORDER — LIDOCAINE HCL (PF) 1 % IJ SOLN
INTRAMUSCULAR | Status: DC | PRN
  Administered 2024-09-18: 3 mL

## 2024-09-18 MED ORDER — COCONUT OIL OIL
1.0000 | TOPICAL_OIL | Status: DC | PRN
  Administered 2024-09-19: 1 via TOPICAL
  Filled 2024-09-18: qty 7.5

## 2024-09-18 MED ORDER — DIBUCAINE (PERIANAL) 1 % EX OINT: 1.0000 | TOPICAL_OINTMENT | CUTANEOUS | Status: DC | PRN

## 2024-09-18 MED ORDER — SODIUM CHLORIDE 0.9 % IV SOLN: 250.0000 mL | INTRAVENOUS | Status: DC | PRN

## 2024-09-18 MED ORDER — FERROUS SULFATE 325 (65 FE) MG PO TABS
325.0000 mg | ORAL_TABLET | Freq: Two times a day (BID) | ORAL | Status: DC
  Administered 2024-09-18 – 2024-09-19 (×3): 325 mg via ORAL
  Filled 2024-09-18 (×3): qty 1

## 2024-09-18 MED ORDER — ZOLPIDEM TARTRATE 5 MG PO TABS: 5.0000 mg | ORAL_TABLET | Freq: Every evening | ORAL | Status: DC | PRN

## 2024-09-18 MED ORDER — ONDANSETRON HCL 4 MG/2ML IJ SOLN: 4.0000 mg | INTRAMUSCULAR | Status: DC | PRN

## 2024-09-18 MED ORDER — IBUPROFEN 600 MG PO TABS
600.0000 mg | ORAL_TABLET | Freq: Four times a day (QID) | ORAL | Status: DC
  Administered 2024-09-18 – 2024-09-19 (×6): 600 mg via ORAL
  Filled 2024-09-18 (×6): qty 1

## 2024-09-18 MED ORDER — BENZOCAINE-MENTHOL 20-0.5 % EX AERO
1.0000 | INHALATION_SPRAY | CUTANEOUS | Status: DC | PRN
  Administered 2024-09-18 – 2024-09-19 (×2): 1 via TOPICAL
  Filled 2024-09-18 (×2): qty 56

## 2024-09-18 MED ORDER — ACETAMINOPHEN 500 MG PO TABS
1000.0000 mg | ORAL_TABLET | Freq: Four times a day (QID) | ORAL | Status: DC
  Administered 2024-09-18 – 2024-09-19 (×6): 1000 mg via ORAL
  Filled 2024-09-18 (×6): qty 2

## 2024-09-18 NOTE — Lactation Note (Signed)
 This note was copied from a baby's chart. Lactation Consultation Note  Patient Name: Boy Nygeria Lager Unijb'd Date: 09/18/2024 Age:26 hours  Reason for consult: Initial assessment;Term;1st time breastfeeding;Mother's request;RN request   Maternal Data  This is mom's 2nd baby, SVD. Mom with no lactation risk factors identified at the time.  On initial visit mom reports the nurse assisted her with breastfeeding and baby latched and breastfed well. Per mom baby also latched and breastfed well after delivery. Mom had called for breastfeeding assistance as baby was sleepy and not latching on. Per nurse's report baby was awakened and breastfed well. Baby with wet and stool diapers. LC number on white board if mom would like additional help with breastfeeding.  Feeding  Mother's feeding choice on admission:breastmilk  Interventions  Breast feeding basics reviewed;Hand express;Education; Reviewed with mom who is breastfeeding for the first time what to expect in first days when breastfeeding: feeding cues, 8-12 feeds in 24 hours, how to know the baby is getting enough, cluster feeding, how to wake a sleepy baby, maximizing position and latch techniques.  Discharge  Mom has a DEBP for home use.  Consult Status  Follow-up Inpatient 09/19/2024  Update provided to care nurse.   Avelina DELENA Gaskins 09/18/2024, 6:41 PM

## 2024-09-18 NOTE — Anesthesia Preprocedure Evaluation (Signed)
 Anesthesia Evaluation  Patient identified by MRN, date of birth, ID band Patient awake    Reviewed: Allergy  & Precautions, NPO status , Patient's Chart, lab work & pertinent test results  History of Anesthesia Complications Negative for: history of anesthetic complications  Airway Mallampati: I  TM Distance: >3 FB Neck ROM: full    Dental no notable dental hx.    Pulmonary neg pulmonary ROS, former smoker   Pulmonary exam normal        Cardiovascular Exercise Tolerance: Good negative cardio ROS Normal cardiovascular exam     Neuro/Psych    GI/Hepatic negative GI ROS, Neg liver ROS,,,  Endo/Other  negative endocrine ROS    Renal/GU negative Renal ROS  negative genitourinary   Musculoskeletal   Abdominal   Peds  Hematology negative hematology ROS (+)   Anesthesia Other Findings History reviewed. No pertinent past medical history.  History reviewed. No pertinent surgical history.  BMI    Body Mass Index: 26.96 kg/m      Reproductive/Obstetrics (+) Pregnancy                              Anesthesia Physical Anesthesia Plan  ASA: 2  Anesthesia Plan: Epidural   Post-op Pain Management:    Induction:   PONV Risk Score and Plan:   Airway Management Planned: Natural Airway  Additional Equipment:   Intra-op Plan:   Post-operative Plan:   Informed Consent: I have reviewed the patients History and Physical, chart, labs and discussed the procedure including the risks, benefits and alternatives for the proposed anesthesia with the patient or authorized representative who has indicated his/her understanding and acceptance.     Dental Advisory Given  Plan Discussed with: Anesthesiologist  Anesthesia Plan Comments: (Patient reports no bleeding problems and no anticoagulant use.   Patient consented for risks of anesthesia including but not limited to:  - adverse reactions to  medications - risk of bleeding, infection and or nerve damage from epidural that could lead to paralysis - risk of headache or failed epidural - nerve damage due to positioning - that if epidural is used for C-section that there is a chance of epidural failure requiring spinal placement or conversion to GA - Damage to heart, brain, lungs, other parts of body or loss of life  Patient voiced understanding and assent.)        Anesthesia Quick Evaluation

## 2024-09-18 NOTE — Discharge Instructions (Signed)
 Vaginal Delivery, Care After Refer to this sheet in the next few weeks. These discharge instructions provide you with information on caring for yourself after delivery. Your caregiver may also give you specific instructions. Your treatment has been planned according to the most current medical practices available, but problems sometimes occur. Call your caregiver if you have any problems or questions after you go home. HOME CARE INSTRUCTIONS Take over-the-counter or prescription medicines only as directed by your caregiver or pharmacist. Do not drink alcohol, especially if you are breastfeeding or taking medicine to relieve pain. Do not smoke tobacco. Continue to use good perineal care. Good perineal care includes: Wiping your perineum from front to back Keeping your perineum clean. You can do sitz baths twice a day, to help keep this area clean Do not use tampons, douche or have sex until your caregiver says it is okay. Shower only and avoid sitting in submerged water, aside from sitz baths Wear a well-fitting bra that provides breast support. Eat healthy foods. Drink enough fluids to keep your urine clear or pale yellow. Eat high-fiber foods such as whole grain cereals and breads, brown rice, beans, and fresh fruits and vegetables every day. These foods may help prevent or relieve constipation. Avoid constipation with high fiber foods or medications, such as miralax or metamucil Follow your caregiver's recommendations regarding resumption of activities such as climbing stairs, driving, lifting, exercising, or traveling. Talk to your caregiver about resuming sexual activities. Resumption of sexual activities is dependent upon your risk of infection, your rate of healing, and your comfort and desire to resume sexual activity. Try to have someone help you with your household activities and your newborn for at least a few days after you leave the hospital. Rest as much as possible. Try to rest or  take a nap when your newborn is sleeping. Increase your activities gradually. Keep all of your scheduled postpartum appointments. It is very important to keep your scheduled follow-up appointments. At these appointments, your caregiver will be checking to make sure that you are healing physically and emotionally. SEEK MEDICAL CARE IF:  You are passing large clots from your vagina. Save any clots to show your caregiver. You have a foul smelling discharge from your vagina. You have trouble urinating. You are urinating frequently. You have pain when you urinate. You have a change in your bowel movements. You have increasing redness, pain, or swelling near your vaginal incision (episiotomy) or vaginal tear. You have pus draining from your episiotomy or vaginal tear. Your episiotomy or vaginal tear is separating. You have painful, hard, or reddened breasts. You have a severe headache. You have blurred vision or see spots. You feel sad or depressed. You have thoughts of hurting yourself or your newborn. You have questions about your care, the care of your newborn, or medicines. You are dizzy or light-headed. You have a rash. You have nausea or vomiting. You were breastfeeding and have not had a menstrual period within 12 weeks after you stopped breastfeeding. You are not breastfeeding and have not had a menstrual period by the 12th week after delivery. You have a fever. SEEK IMMEDIATE MEDICAL CARE IF:  You have persistent pain. You have chest pain. You have shortness of breath. You faint. You have leg pain. You have stomach pain. Your vaginal bleeding saturates two or more sanitary pads in 1 hour. MAKE SURE YOU:  Understand these instructions. Will watch your condition. Will get help right away if you are not doing well or  get worse. Document Released: 11/03/2000 Document Revised: 03/23/2014 Document Reviewed: 07/03/2012 [facility] Patient Information 2015 [place], [place]. This  information is not intended to replace advice given to you by your health care provider. Make sure you discuss any questions you have with your health care provider.  Sitz Bath A sitz bath is a warm water bath taken in the sitting position. The water covers only the hips and butt (buttocks). We recommend using one that fits in the toilet, to help with ease of use and cleanliness. It may be used for either healing or cleaning purposes. Sitz baths are also used to relieve pain, itching, or muscle tightening (spasms). The water may contain medicine. Moist heat will help you heal and relax.  HOME CARE  Take 3 to 4 sitz baths a day. Fill the bathtub half-full with warm water. Sit in the water and open the drain a little. Turn on the warm water to keep the tub half-full. Keep the water running constantly. Soak in the water for 15 to 20 minutes. After the sitz bath, pat the affected area dry. GET HELP RIGHT AWAY IF: You get worse instead of better. Stop the sitz baths if you get worse. MAKE SURE YOU: Understand these instructions. Will watch your condition. Will get help right away if you are not doing well or get worse. Document Released: 12/14/2004 Document Revised: 07/31/2012 Document Reviewed: 03/06/2011 [facility] Patient Information 2015 [place], [place]. This information is not intended to replace advice given to you by your health care provider. Make sure you discuss any questions you have with your health care provider.

## 2024-09-18 NOTE — Progress Notes (Signed)
 Post Partum Day 0 Subjective: Doing well, no complaints.  Tolerating regular diet, pain with PO meds, voiding and ambulating without difficulty.  No CP SOB Fever,Chills, N/V or leg pain; denies nipple or breast pain, no HA change of vision, RUQ/epigastric pain  Objective: BP 103/67 (BP Location: Right Arm)   Pulse 70   Temp 98.1 F (36.7 C) (Oral)   Resp 20   Ht 5' 5 (1.651 m)   Wt 73.5 kg   LMP 12/19/2023 (Exact Date)   SpO2 99%   Breastfeeding Unknown   BMI 26.96 kg/m    Physical Exam:  General: NAD Breasts: soft/nontender CV: RRR Pulm: nl effort, CTABL Abdomen: soft, NT, BS x 4 Perineum: minimal edema, intact Lochia: moderate Uterine Fundus: fundus firm and 1 fb below umbilicus DVT Evaluation: no cords, ttp LEs   Recent Labs    09/17/24 2325 09/18/24 0532  HGB 12.4 11.2*  HCT 36.4 31.9*  WBC 11.0* 12.8*  PLT 186 166    Assessment/Plan: 28 y.o. G2P2002 postpartum day # 0  - Continue routine PP care - Lactation consult PRN - Discussed contraceptive options including implant, IUDs hormonal and non-hormonal, injection, pills/ring/patch, condoms, and NFP.  - Acute blood loss anemia, clinically insignificant - hemoglobin changed from 12.4 to 11.2, patient is asymptomatic, hemodynamically stable; start po ferrous sulfate  BID with stool softeners, - Immunization status: all Imms up to date  Disposition: Does not desire Dc home today.   Edsel Charlies Blush, CNM 09/18/2024 10:53 AM

## 2024-09-18 NOTE — Discharge Summary (Signed)
 Postpartum Discharge Summary  Patient Name: Elizabeth Salazar DOB: 11-05-1996 MRN: [id number]  Date of admission: 09/17/2024 Delivery date:09/18/2024 Delivering provider: MACKIE, ANNA M Date of discharge: 09/19/2024  Primary OB: [facility] OFE:Ejupzwu'd last menstrual period was 12/19/2023 (exact date). EDC Estimated Date of Delivery: 09/24/24 Gestational Age at Delivery: [redacted]w[redacted]d   Admitting diagnosis: Normal labor [O80, Z37.9] Intrauterine pregnancy: [redacted]w[redacted]d     Secondary diagnosis:   Principal Problem:   NSVD (normal spontaneous vaginal delivery) Active Problems:   HSV-2 infection   Rh negative, antepartum   Normal labor   Elevated glucose tolerance test   Discharge Diagnosis: Term Pregnancy Delivered      Hospital course: Onset of Labor With Vaginal Delivery      28 y.o. yo [id number] at [redacted]w[redacted]d was admitted in Active Labor on 09/17/2024. Labor course was uncomplicated. She was expectantly managed. She progressed well to C/C/+2 with a strong urge to push.  She pushed effectively over approximately 30 minutes for a spontaneous vaginal birth. Rotation of fetal head from LOP to ROA prior to crowning.   Membrane Rupture Time/Date: 12:55 AM,09/18/2024  Delivery Method:Vaginal, Spontaneous Operative Delivery:N/A Episiotomy: None Lacerations:  None Patient had a postpartum course complicated by none.  She is ambulating, tolerating a regular diet, passing flatus, and urinating well. Patient is discharged home in stable condition on 09/19/24.  Newborn Data: Birth date:09/18/2024 Birth time:1:21 AM Gender:Female Brady Living status:Living Apgars:8 ,9  Weight:3570 g                                            Post partum procedures: none Augmentation:: N/A Complications: None Delivery Type: spontaneous vaginal delivery Anesthesia: epidural anesthesia Placenta: spontaneous To Pathology: No   Prenatal Labs:  Blood type/Rh A NEG Performed at [facility], [address] [place], [place] [address]    Antibody screen Negative   Rubella Immune    Varicella Immune  RPR NR    HBsAg Neg   Hep C NR   HIV Neg    GC neg  Chlamydia neg  Genetic screening cfDNA negative   1 hour GTT 148  3 hour GTT [phone]   GBS Negative       Magnesium Sulfate received: No BMZ received: No Rhophylac:was not indicated MMR: was not indicated Varivax vaccine given: was not indicated Tdap vaccine: Given prenatally Flu vaccine: declined RSV vaccine: declined  Transfusion:No  Physical exam  Vitals:   09/18/24 1546 09/18/24 1940 09/19/24 0203 09/19/24 0733  BP: 108/70 91/61 108/76 107/76  Pulse: 85 73 71 77  Resp: 18 18 20 18   Temp: 98.5 F (36.9 C) 97.9 F (36.6 C) 98.4 F (36.9 C) 98.1 F (36.7 C)  TempSrc: Oral Oral  Oral  SpO2: 99% 99% 100% 100%  Weight:      Height:       General: alert, cooperative, and no distress Lochia: appropriate Uterine Fundus: firm Perineum:minimal edema/intact DVT Evaluation: No evidence of DVT seen on physical exam.  Labs: Lab Results  Component Value Date   WBC 12.8 (H) 09/18/2024   HGB 11.2 (L) 09/18/2024   HCT 31.9 (L) 09/18/2024   MCV 91.1 09/18/2024   PLT 166 09/18/2024      Latest Ref Rng & Units 08/16/2023    3:13 PM  CMP  Glucose 70 - 99 mg/dL 88   BUN 6 - 20 mg/dL  10   Creatinine 0.57 - 1.00 mg/dL 9.24   Sodium 865 - 855 mmol/L 139   Potassium 3.5 - 5.2 mmol/L 3.8   Chloride 96 - 106 mmol/L 102   CO2 20 - 29 mmol/L 23   Calcium  8.7 - 10.2 mg/dL 9.2   Total Protein 6.0 - 8.5 g/dL 7.0   Total Bilirubin 0.0 - 1.2 mg/dL 0.3   Alkaline Phos 44 - 121 IU/L 45   AST 0 - 40 IU/L 19   ALT 0 - 32 IU/L 14    Edinburgh Score:    09/18/2024   10:49 AM  Edinburgh Postnatal Depression Scale Screening Tool  I have been able to laugh and see the funny side of things. 0  I have looked forward with enjoyment to things. 0  I have blamed myself unnecessarily when things went wrong. 1  I have  been anxious or worried for no good reason. 0  I have felt scared or panicky for no good reason. 0  Things have been getting on top of me. 0  I have been so unhappy that I have had difficulty sleeping. 0  I have felt sad or miserable. 0  I have been so unhappy that I have been crying. 0  The thought of harming myself has occurred to me. 0  Edinburgh Postnatal Depression Scale Total 1     Postpartum VTE Prophylaxis  Recommend 6 weeks of prophylactic anticoagulation with LMWH or subcutaneous unfractionated heparin if 1 or more high risk factor is present.  Recommend 14 days of prophylactic anticoagulation with LMWH or subcutaneous unfractionated heparin if 3 or more moderate risk factors are present.   Risk assessment for postpartum VTE and prophylactic treatment: High risk factors: None Moderate risk factors: None  Postpartum VTE prophylaxis with LMWH not indicated    After visit meds:  Allergies as of 09/19/2024   No Known Allergies      Medication List     STOP taking these medications    acyclovir  200 MG capsule Commonly known as: ZOVIRAX    ferrous sulfate  325 (65 FE) MG tablet       Discharge home in stable condition Infant Feeding: Breast Infant Disposition:home with mother Discharge instruction: per After Visit Summary and Postpartum booklet. Activity: Advance as tolerated. Pelvic rest for 6 weeks.  Diet: routine diet Anticipated Birth Control:  Contraceptives: Progesterone only pills Postpartum Appointment:6 weeks Additional Postpartum F/U: none Future Appointments:No future appointments. Follow up Visit:  Follow-up Information     [facility]. Schedule an appointment as soon as possible for a visit in 6 week(s).   Why: postpartum visit Contact information: [address] [place]  [address] [phone]                Plan:  Elizabeth Salazar was discharged to home in good condition. Follow-up appointment as  directed.    SignedBETHA Margery FORBES Myron 09/19/2024 9:26 AM

## 2024-09-18 NOTE — Anesthesia Procedure Notes (Signed)
 Epidural Patient location during procedure: OB Start time: 09/18/2024 12:18 AM End time: 09/18/2024 12:22 AM  Staffing Anesthesiologist: Chesley Lendia CROME, MD Performed: anesthesiologist   Preanesthetic Checklist Completed: patient identified, IV checked, site marked, risks and benefits discussed, surgical consent, monitors and equipment checked, pre-op evaluation and timeout performed  Epidural Patient position: sitting Prep: ChloraPrep Patient monitoring: heart rate, continuous pulse ox and blood pressure Approach: midline Location: L3-L4 Injection technique: LOR saline  Needle:  Needle type: Tuohy  Needle gauge: 17 G Needle length: 9 cm and 9 Needle insertion depth: 7 cm Catheter type: closed end flexible Catheter size: 19 Gauge Catheter at skin depth: 11 cm Test dose: negative and 1.5% lidocaine  with Epi 1:200 K  Assessment Sensory level: T10 Events: blood not aspirated, injection not painful, no injection resistance, paresthesia (R side, resolved with catheter repostioning) and negative IV test  Additional Notes 1 attempt Pt. Evaluated and documentation done after procedure finished. Patient identified. Risks/Benefits/Options discussed with patient including but not limited to bleeding, infection, nerve damage, paralysis, failed block, incomplete pain control, headache, blood pressure changes, nausea, vomiting, reactions to medication both or allergic, itching and postpartum back pain. Confirmed with bedside nurse the patient's most recent platelet count. Confirmed with patient that they are not currently taking any anticoagulation, have any bleeding history or any family history of bleeding disorders. Patient expressed understanding and wished to proceed. All questions were answered. Sterile technique was used throughout the entire procedure. Please see nursing notes for vital signs. Test dose was given through epidural catheter and negative prior to continuing to dose  epidural or start infusion. Warning signs of high block given to the patient including shortness of breath, tingling/numbness in hands, complete motor block, or any concerning symptoms with instructions to call for help. Patient was given instructions on fall risk and not to get out of bed. All questions and concerns addressed with instructions to call with any issues or inadequate analgesia.    Patient tolerated the insertion well without immediate complications. Reason for block:procedure for pain

## 2024-09-19 ENCOUNTER — Encounter: Payer: Self-pay | Admitting: Obstetrics and Gynecology

## 2024-09-19 NOTE — Lactation Note (Signed)
 This note was copied from a baby's chart. Lactation Consultation Note  Patient Name: Boy Tyliyah Mcmeekin Unijb'd Date: 09/19/2024 Age:26 hours Reason for consult: Follow-up assessment;Term;Other (Comment) (Discharge Education)   Maternal Data Follow up assessment w/ infant and parents.  Mom expressed that she feels breastfeeding is going great.  She thinks infant is just now starting to clusterfeed.  She just introduced infant to a pacifier but that was only so she could eat. Mom had a couple of questions about pumping once going back to work and how pumping works when you are breastfeeding.  Feeding Mother's Current Feeding Choice: Breast Milk  Interventions Interventions: Education;CDC milk storage guidelines  LC provided education on replacing a breastfeeding session w/ a pumping session when she is doing both.  LC also recommended possibly using milk collectors to collect milk when she is breastfeeding and leaking on the otherside.  Discharge Discharge Education: Engorgement and breast care;Warning signs for feeding baby;Outpatient recommendation  Education on engorgement prevention/treatment was discussed as well as breastmilk storage guidelines.  LC provided patient with a handout on breastmilk storage guidelines from [facility]. [facility] outpatient lactation services phone number written on the white board in the room.  Patient verbalized understanding.  LC also provided education from the postpartum book about warning signs to look for in a poor feeding.    Consult Status Consult Status: Complete Follow-up type: Call as needed    Hamid Brookens S Azad Calame 09/19/2024, 9:53 AM

## 2024-09-19 NOTE — Plan of Care (Signed)
  Problem: Education: Goal: Knowledge of Childbirth will improve Outcome: Progressing Goal: Ability to make informed decisions regarding treatment and plan of care will improve Outcome: Progressing Goal: Ability to state and carry out methods to decrease the pain will improve Outcome: Progressing Goal: Individualized Educational Video(s) Outcome: Progressing   Problem: Coping: Goal: Ability to verbalize concerns and feelings about labor and delivery will improve Outcome: Progressing   Problem: Life Cycle: Goal: Ability to make normal progression through stages of labor will improve Outcome: Progressing Goal: Ability to effectively push during vaginal delivery will improve Outcome: Progressing   Problem: Role Relationship: Goal: Will demonstrate positive interactions with the child Outcome: Progressing   Problem: Safety: Goal: Risk of complications during labor and delivery will decrease Outcome: Progressing   Problem: Pain Management: Goal: Relief or control of pain from uterine contractions will improve Outcome: Progressing   Problem: Education: Goal: Knowledge of condition will improve Outcome: Progressing Goal: Individualized Educational Video(s) Outcome: Progressing Goal: Individualized Newborn Educational Video(s) Outcome: Progressing   Problem: Activity: Goal: Will verbalize the importance of balancing activity with adequate rest periods Outcome: Progressing Goal: Ability to tolerate increased activity will improve Outcome: Progressing   Problem: Coping: Goal: Ability to identify and utilize available resources and services will improve Outcome: Progressing   Problem: Life Cycle: Goal: Chance of risk for complications during the postpartum period will decrease Outcome: Progressing   Problem: Role Relationship: Goal: Ability to demonstrate positive interaction with newborn will improve Outcome: Progressing   Problem: Skin Integrity: Goal: Demonstration  of wound healing without infection will improve Outcome: Progressing

## 2024-09-19 NOTE — Anesthesia Postprocedure Evaluation (Signed)
 Anesthesia Post Note  Patient: Teacher, English As A Foreign Language  Procedure(s) Performed: AN AD HOC LABOR EPIDURAL  Patient location during evaluation: Mother Baby Anesthesia Type: Epidural Level of consciousness: awake and alert and oriented Pain management: satisfactory to patient Vital Signs Assessment: post-procedure vital signs reviewed and stable Respiratory status: spontaneous breathing Cardiovascular status: stable Postop Assessment: patient able to bend at knees, no apparent nausea or vomiting, adequate PO intake and able to ambulate Anesthetic complications: no Comments: Has been able to void.   No notable events documented.   Last Vitals:  Vitals:   09/18/24 1940 09/19/24 0203  BP: 91/61 108/76  Pulse: 73 71  Resp: 18 20  Temp: 36.6 C 36.9 C  SpO2: 99% 100%    Last Pain:  Vitals:   09/19/24 0201  TempSrc:   PainSc: 2                  Roselee Hint

## 2024-09-19 NOTE — Progress Notes (Signed)
 Discharge instructions reviewed with patient and significant other.  Questions answered and follow up care reviewed.  Printed copies given to patient for reference after discharge home.

## 2024-09-24 ENCOUNTER — Inpatient Hospital Stay: Admit: 2024-09-24

## 2024-10-01 DIAGNOSIS — Z419 Encounter for procedure for purposes other than remedying health state, unspecified: Secondary | ICD-10-CM | POA: Diagnosis not present

## 2024-10-31 DIAGNOSIS — Z419 Encounter for procedure for purposes other than remedying health state, unspecified: Secondary | ICD-10-CM | POA: Diagnosis not present
